# Patient Record
Sex: Female | Born: 1995 | Race: Black or African American | Hispanic: No | Marital: Single | Smoking: Never smoker
Health system: Southern US, Community
[De-identification: ages and names within clinical notes are randomized; demographics above are authoritative.]

## PROBLEM LIST (undated history)

## (undated) DIAGNOSIS — Z87440 Personal history of urinary (tract) infections: Secondary | ICD-10-CM

## (undated) DIAGNOSIS — R519 Headache, unspecified: Secondary | ICD-10-CM

## (undated) DIAGNOSIS — Z789 Other specified health status: Secondary | ICD-10-CM

## (undated) DIAGNOSIS — F32A Depression, unspecified: Secondary | ICD-10-CM

## (undated) DIAGNOSIS — D649 Anemia, unspecified: Secondary | ICD-10-CM

## (undated) HISTORY — DX: Headache, unspecified: R51.9

## (undated) HISTORY — DX: Personal history of urinary (tract) infections: Z87.440

## (undated) HISTORY — PX: NO PAST SURGERIES: SHX2092

## (undated) HISTORY — DX: Depression, unspecified: F32.A

## (undated) HISTORY — DX: Anemia, unspecified: D64.9

---
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
## 2014-03-06 ENCOUNTER — Ambulatory Visit: Payer: Self-pay | Admitting: Advanced Practice Midwife

## 2014-04-17 ENCOUNTER — Encounter: Payer: Self-pay | Admitting: Maternal & Fetal Medicine

## 2014-06-09 ENCOUNTER — Observation Stay: Payer: Self-pay | Admitting: Obstetrics and Gynecology

## 2014-06-09 LAB — URINALYSIS, COMPLETE
Bilirubin,UR: NEGATIVE
Blood: NEGATIVE
Glucose,UR: NEGATIVE mg/dL (ref 0–75)
Ketone: NEGATIVE
Leukocyte Esterase: NEGATIVE
Nitrite: NEGATIVE
PROTEIN: NEGATIVE
Ph: 6 (ref 4.5–8.0)
Specific Gravity: 1.005 (ref 1.003–1.030)

## 2014-07-13 ENCOUNTER — Observation Stay: Payer: Self-pay

## 2014-08-01 ENCOUNTER — Observation Stay: Payer: Self-pay | Admitting: Obstetrics and Gynecology

## 2014-08-02 ENCOUNTER — Inpatient Hospital Stay: Payer: Self-pay

## 2014-08-02 LAB — CBC WITH DIFFERENTIAL/PLATELET
BASOS ABS: 0 10*3/uL (ref 0.0–0.1)
Basophil %: 0.3 %
Eosinophil #: 0.1 10*3/uL (ref 0.0–0.7)
Eosinophil %: 0.6 %
HCT: 35 % (ref 35.0–47.0)
HGB: 10.9 g/dL — ABNORMAL LOW (ref 12.0–16.0)
LYMPHS ABS: 2 10*3/uL (ref 1.0–3.6)
Lymphocyte %: 21.3 %
MCH: 25.4 pg — AB (ref 26.0–34.0)
MCHC: 31.3 g/dL — ABNORMAL LOW (ref 32.0–36.0)
MCV: 81 fL (ref 80–100)
MONO ABS: 0.9 x10 3/mm (ref 0.2–0.9)
MONOS PCT: 9.5 %
NEUTROS PCT: 68.3 %
Neutrophil #: 6.5 10*3/uL (ref 1.4–6.5)
PLATELETS: 265 10*3/uL (ref 150–440)
RBC: 4.31 10*6/uL (ref 3.80–5.20)
RDW: 13.9 % (ref 11.5–14.5)
WBC: 9.5 10*3/uL (ref 3.6–11.0)

## 2014-08-02 LAB — DRUG SCREEN, URINE

## 2014-08-03 LAB — GC/CHLAMYDIA PROBE AMP

## 2014-08-05 LAB — HEMATOCRIT: HCT: 30.8 % — ABNORMAL LOW (ref 35.0–47.0)

## 2015-04-17 NOTE — H&P (Signed)
L&D Evaluation:  History:  HPI 19 yo G1 at 4138w1d [facility] presenting with LOF.  No VB, no ctx, +FM   Presents with leaking fluid   Patient's Medical History No Chronic Illness   Patient's Surgical History none   Medications Pre Natal Vitamins   Allergies NKDA   Social History drugs  +THC UDS   Family History Non-Contributory   Exam:  Vital Signs stable   Urine Protein not completed   General no apparent distress   Mental Status clear   Abdomen gravid, non-tender   Estimated Fetal Weight Average for gestational age   Edema no edema   FHT 140, moderate, no accels, no decels   Ucx absent   Impression:  Impression 19 yo G2P0010 at 2038w1d presentin for r/o PPROM   Plan:  Comments 1) R/O PPROM - no evidence of rupture  2) Fetus - category I tracing  3) Disposition - discharge home   Electronic Signatures: Lorrene ReidStaebler, Varonica Siharath M (MD)  (Signed 03-Jul-15 15:19)  Authored: L&D Evaluation   Last Updated: 03-Jul-15 15:19 by Lorrene ReidStaebler, Cindel Daugherty M (MD)

## 2015-04-17 NOTE — H&P (Signed)
L&D Evaluation:  History Expanded:  HPI 19 yo G1P0 at 653w5d by 1 st trimester US that agrees with LMP and EDC of 07/27/14 presenting with Decreased Fetal movement. She says she has asked the [facility] when they would set her up for induction and they were not responsive so she decided to come to the hospital today.  No VB, occ ctx. Pt has a history by UDS of MH usage but when asked today when her last time smoking was she was incredulous that we asked that. She has had excessive weight gain this preganancy. started at 149 now over 215 pounds, she has not  had tdap and declined on 05/15/14. S[facility]. Desires DMPA postpartum, 1 hour glucola is 53, suggestive of shunting to fetus of glucose. GBS neg on 7/30.   Gravida 2   Term 0   PreTerm 0   Abortion 1   Living 0   Blood Type (Maternal) O positive   Group B Strep Results Maternal (Result >5wks must be treated as unknown) negative    Maternal HIV Negative   Maternal Syphilis Ab Nonreactive   Maternal Varicella Immune   Rubella Results (Maternal) immune   Maternal T-Dap Unknown   [facility]   Presents with wanting to be induced and decreased fetal movement   Patient's Medical History No Chronic Illness   Patient's Surgical History none   Medications Pre Natal Vitamins   Allergies NKDA   Social History drugs  +THC UDS   Family History Non-Contributory   Current Prenatal Course Notable For poor visit history a lot of DNKA    Exam:  Vital Signs stable  133/60   Urine Protein not completed   General no apparent distress   Mental Status clear   Abdomen gravid, non-tender   Estimated Fetal Weight Average for gestational age   Fetal Position vertex   Edema no edema   Pelvic no external lesions, 1-2/50/-3 ballotable   Mebranes Intact   FHT 140, moderate, pos accels,  no decels   Ucx irregular   Skin dry   Lymph no lymphadenopathy    Impression:   Impression early labor, decreased fetal movement, 19 yo G2P0010 at 314w1d presentin for r/o PPROM   Plan:  Comments 1) decreased fetal movement-CAT 1 tracing no decels and pos accels,   2) postdates-CAT 1 tracing, induce Wed the 26th with cervicil for pit on Thursday.   3) Disposition - discharge home to retrun with contractions or SROM   Follow Up Appointment already scheduled   Electronic Signatures: Adria DevonKlett, Elijah Michaelis (MD)  (Signed 25-Aug-15 16:30)  Authored: L&D Evaluation   Last Updated: 25-Aug-15 16:30 by Adria DevonKlett, Zakayla Martinec (MD)

## 2015-04-17 NOTE — H&P (Signed)
L&D Evaluation:  History:  HPI 19 yo G1P0 at 8952w6d by 1 st trimester US that agrees with LMP=10/20/2013  and EDC of 07/27/14. She presents for IOL.   No VB. Has some mild ctxs which she describes as "tightening". Baby active. Prenatal care at [facility] remarkable for a positive UDS for MJ. She has had excessive weight gain this pregnancy. started at 149 now over 215 pounds, she has not  had tdap and declined on 05/15/14. SHe [facility] a [facility]. Desires DMPA postpartum, 1 hour glucola is 53, suggestive of shunting to fetus of glucose. GBS neg on 7/30. O POS/ Up to date on varicella and Rubella vaccines.   Presents with induction   Patient's Medical History No Chronic Illness   Patient's Surgical History none   Medications Pre Natal Vitamins   Allergies NKDA   Social History drugs  +THC UDS   Family History Non-Contributory   ROS:  ROS All systems were reviewed.  HEENT, CNS, GI, GU, Respiratory, CV, Renal and Musculoskeletal systems were found to be normal., see HPI   Exam:  Vital Signs stable  113/94, 120/66    Urine Protein not completed   General anxious   Mental Status clear    Chest clear    Heart normal sinus rhythm, no murmur/gallop/rubs   Abdomen gravid, non-tender   Estimated Fetal Weight 8#   Fetal Position cephalic   Edema +1 to +2 edema in ankles/feet    Reflexes 2+    Pelvic no external lesions, 1+/75%/-1   Mebranes Intact   FHT normal rate with no decels, 130 baseline with accels to 160s-170s   FHT Description moderate variability-CAt 1   Ucx q5-7, mild   Skin dry   Impression:  Impression IUP at 40 6/7 weeks for IOL   Plan:  Plan EFM/NST, monitor contractions and for cervical change, Cervidil tonight after discussion regarding risks of induction including hyperstimulation, FITL, C-section, failed induction. Sleeping pill if desires   Electronic Signatures: Trinna BalloonGutierrez, Flynt Breeze L (CNM)  (Signed 26-Aug-15  22:21)  Authored: L&D Evaluation   Last Updated: 26-Aug-15 22:21 by Trinna BalloonGutierrez, Durell Lofaso L (CNM)

## 2016-02-05 ENCOUNTER — Emergency Department
Admission: EM | Admit: 2016-02-05 | Discharge: 2016-02-05 | Disposition: A | Discharge status: Home / Self Care | Payer: Medicaid Other | Attending: Emergency Medicine | Admitting: Emergency Medicine

## 2016-02-05 ENCOUNTER — Encounter: Payer: Self-pay | Admitting: Medical Oncology

## 2016-02-05 DIAGNOSIS — L02413 Cutaneous abscess of right upper limb: Secondary | ICD-10-CM | POA: Diagnosis present

## 2016-02-05 MED ORDER — SULFAMETHOXAZOLE-TRIMETHOPRIM 800-160 MG PO TABS: 1.0000 | ORAL_TABLET | Freq: Two times a day (BID) | ORAL | Status: DC

## 2016-02-05 MED ORDER — OXYCODONE-ACETAMINOPHEN 5-325 MG PO TABS
1.0000 | ORAL_TABLET | Freq: Once | ORAL | Status: AC
  Administered 2016-02-05: 1 via ORAL
  Filled 2016-02-05: qty 1

## 2016-02-05 MED ORDER — OXYCODONE-ACETAMINOPHEN 7.5-325 MG PO TABS: 1.0000 | ORAL_TABLET | Freq: Four times a day (QID) | ORAL | Status: DC | PRN

## 2016-02-05 MED ORDER — LIDOCAINE HCL (PF) 1 % IJ SOLN
INTRAMUSCULAR | Status: AC
  Filled 2016-02-05: qty 5

## 2016-02-05 NOTE — ED Notes (Signed)
Pt has abscessed area to rt forearm.

## 2016-02-05 NOTE — Discharge Instructions (Signed)
Incision and Drainage Incision and drainage is a procedure in which a sac-like structure (cystic structure) is opened and drained. The area to be drained usually contains material such as pus, fluid, or blood.  LET YOUR CAREGIVER KNOW ABOUT:   Allergies to medicine.  Medicines taken, including vitamins, herbs, eyedrops, over-the-counter medicines, and creams.  Use of steroids (by mouth or creams).  Previous problems with anesthetics or numbing medicines.  History of bleeding problems or blood clots.  Previous surgery.  Other health problems, including diabetes and kidney problems.  Possibility of pregnancy, if this applies. RISKS AND COMPLICATIONS  Pain.  Bleeding.  Scarring.  Infection. BEFORE THE PROCEDURE  You may need to have an ultrasound or other imaging tests to see how large or deep your cystic structure is. Blood tests may also be used to determine if you have an infection or how severe the infection is. You may need to have a tetanus shot. PROCEDURE  The affected area is cleaned with a cleaning fluid. The cyst area will then be numbed with a medicine (local anesthetic). A small incision will be made in the cystic structure. A syringe or catheter may be used to drain the contents of the cystic structure, or the contents may be squeezed out. The area will then be flushed with a cleansing solution. After cleansing the area, it is often gently packed with a gauze or another wound dressing. Once it is packed, it will be covered with gauze and tape or some other type of wound dressing. AFTER THE PROCEDURE   Often, you will be allowed to go home right after the procedure.  You may be given antibiotic medicine to prevent or heal an infection.  If the area was packed with gauze or some other wound dressing, you will likely need to come back in 1 to 2 days to get it removed.  The area should heal in about 14 days.   This information is not intended to replace advice given  to you by your health care provider. Make sure you discuss any questions you have with your health care provider.   Document Released: 05/20/2001 Document Revised: 05/25/2012 Document Reviewed: 01/19/2012 Elsevier Interactive Patient Education 2016 Elsevier Inc.  

## 2016-02-05 NOTE — ED Provider Notes (Signed)
[facility] Emergency Department Provider Note  ____________________________________________  Time seen: Approximately 5:29 PM  I have reviewed the triage vital signs and the nursing notes.   HISTORY  Chief Complaint Abscess    HPI Betty Chavez is a 20 y.o. female patient complaining of a raised red area to the right forearm for 3-4 days. Patient is slight drainage from the area. Patient states she thinks bit by a spider. Patient denies any fever or chills associated with this complaint patient denies any loss sensation or function of the right forearm. No palliative measures taken for this complaint. Patient stated there is no pain at this time.  History reviewed. No pertinent past medical history.  There are no active problems to display for this patient.   History reviewed. No pertinent past surgical history.  Current Outpatient Rx  Name  Route  Sig  Dispense  Refill  . oxyCODONE-acetaminophen (PERCOCET) 7.5-325 MG tablet   Oral   Take 1 tablet by mouth every 6 (six) hours as needed for severe pain.   12 tablet   0   . sulfamethoxazole-trimethoprim (BACTRIM DS,SEPTRA DS) 800-160 MG tablet   Oral   Take 1 tablet by mouth 2 (two) times daily.   20 tablet   0     Allergies Review of patient's allergies indicates no known allergies.  No family history on file.  Social History Social History  Substance Use Topics  . Smoking status: None  . Smokeless tobacco: None  . Alcohol Use: None    Review of Systems Constitutional: No fever/chills Eyes: No visual changes. ENT: No sore throat. Cardiovascular: Denies chest pain. Respiratory: Denies shortness of breath. Gastrointestinal: No abdominal pain.  No nausea, no vomiting.  No diarrhea.  No constipation. Genitourinary: Negative for dysuria. Musculoskeletal: Negative for back pain. Skin: Negative for rash. Swelling and redness Neurological: Negative for headaches, focal weakness or  numbness.   ____________________________________________   PHYSICAL EXAM:  VITAL SIGNS: ED Triage Vitals  Enc Vitals Group     BP --      Pulse --      Resp --      Temp --      Temp src --      SpO2 --      Weight --      Height --      Head Cir --      Peak Flow --      Pain Score 02/05/16 1712 0     Pain Loc --      Pain Edu? --      Excl. in GC? --     Constitutional: Alert and oriented. Well appearing and in no acute distress. Eyes: Conjunctivae are normal. PERRL. EOMI. Head: Atraumatic. Nose: No congestion/rhinnorhea. Mouth/Throat: Mucous membranes are moist.  Oropharynx non-erythematous. Neck: No stridor. No cervical spine tenderness to palpation. Cardiovascular: Normal rate, regular rhythm. Grossly normal heart sounds.  Good peripheral circulation. Respiratory: Normal respiratory effort.  No retractions. Lungs CTAB. Gastrointestinal: Soft and nontender. No distention. No abdominal bruits. No CVA tenderness. Musculoskeletal: No lower extremity tenderness nor edema.  No joint effusions. Neurologic:  Normal speech and language. No gross focal neurologic deficits are appreciated. No gait instability. Skin:  Skin is warm, dry and intact. No rash noted. Nausea lesion on erythematous base. Mild discharge from the area. Psychiatric: Mood and affect are normal. Speech and behavior are normal.  ____________________________________________   LABS (all labs ordered are listed, but only abnormal  results are displayed)  Labs Reviewed - No data to display ____________________________________________  EKG   ____________________________________________  RADIOLOGY   ____________________________________________   PROCEDURES  Procedure(s) performed: See procedure note  Critical Care performed: No INCISION AND DRAINAGE Performed by: Joni Reining Consent: Verbal consent obtained. Risks and benefits: risks, benefits and alternatives were discussed Type:  abscess  Body area: Right forearm  Anesthesia: local infiltration  Incision was made with a scalpel.  Local anesthetic: lidocaine 1% without epinephrine  Anesthetic total: 2 ml  Complexity: complex Blunt dissection to break up loculations  Drainage: purulent  Drainage amount: Small   Packing material: 1/4 in iodoform gauze  Patient tolerance: Patient tolerated the procedure well with no immediate complications.    ____________________________________________   INITIAL IMPRESSION / ASSESSMENT AND PLAN / ED COURSE  Pertinent labs & imaging results that were available during my care of the patient were reviewed by me and considered in my medical decision making (see chart for details).  Abscess left forearm. Patient given discharge care instructions. Patient given a prescription for Percocet and Bactrim DS. Patient advised return to ER 2 days for reevaluation. ____________________________________________   FINAL CLINICAL IMPRESSION(S) / ED DIAGNOSES  Final diagnoses:  Abscess of right forearm      Joni Reining, PA-C 02/05/16 1819  Arnaldo Natal, MD 02/26/16 1123

## 2016-02-05 NOTE — ED Notes (Signed)
Developed a red raised area to right forearm about 3-4 days ago.  States area is draining slightly

## 2016-02-07 ENCOUNTER — Emergency Department
Admission: EM | Admit: 2016-02-07 | Discharge: 2016-02-07 | Disposition: A | Discharge status: Home / Self Care | Payer: Medicaid Other | Attending: Emergency Medicine | Admitting: Emergency Medicine

## 2016-02-07 ENCOUNTER — Encounter: Payer: Self-pay | Admitting: Emergency Medicine

## 2016-02-07 DIAGNOSIS — Z5189 Encounter for other specified aftercare: Secondary | ICD-10-CM

## 2016-02-07 DIAGNOSIS — Z79899 Other long term (current) drug therapy: Secondary | ICD-10-CM | POA: Diagnosis not present

## 2016-02-07 DIAGNOSIS — Z4801 Encounter for change or removal of surgical wound dressing: Secondary | ICD-10-CM | POA: Diagnosis present

## 2016-02-07 NOTE — ED Provider Notes (Signed)
[facility] Emergency Department Provider Note  ____________________________________________  Time seen: Approximately 3:45 PM  I have reviewed the triage vital signs and the nursing notes.   HISTORY  Chief Complaint Wound Check    HPI Betty Chavez is a 20 y.o. female who was seen on February 28 for an abscess to the right forearm. Incision and drainage was performed. She returns for a wound check. She reports pain has improved. She has not changed the bandage. No fevers or chills. Otherwise is doing fine. She did not obtain the antibiotics.   History reviewed. No pertinent past medical history.  There are no active problems to display for this patient.   History reviewed. No pertinent past surgical history.  Current Outpatient Rx  Name  Route  Sig  Dispense  Refill  . oxyCODONE-acetaminophen (PERCOCET) 7.5-325 MG tablet   Oral   Take 1 tablet by mouth every 6 (six) hours as needed for severe pain.   12 tablet   0   . sulfamethoxazole-trimethoprim (BACTRIM DS,SEPTRA DS) 800-160 MG tablet   Oral   Take 1 tablet by mouth 2 (two) times daily.   20 tablet   0     Allergies Review of patient's allergies indicates no known allergies.  No family history on file.  Social History Social History  Substance Use Topics  . Smoking status: Never Smoker   . Smokeless tobacco: None  . Alcohol Use: No    Review of Systems Constitutional: No fever/chills Eyes: No visual changes. ENT: No sore throat. Cardiovascular: Denies chest pain. Respiratory: Denies shortness of breath. Gastrointestinal: No abdominal pain.  No nausea, no vomiting.  No diarrhea.  No constipation. Genitourinary: Negative for dysuria. Musculoskeletal: Negative for back pain. Skin: Negative for rash. Neurological: Negative for headaches, focal weakness or numbness. 10-point ROS otherwise negative.  ____________________________________________   PHYSICAL EXAM:  VITAL  SIGNS: ED Triage Vitals  Enc Vitals Group     BP 02/07/16 1513 120/83 mmHg     Pulse Rate 02/07/16 1513 65     Resp 02/07/16 1513 16     Temp 02/07/16 1513 99 F (37.2 C)     Temp Source 02/07/16 1513 Oral     SpO2 02/07/16 1513 99 %     Weight 02/07/16 1513 143 lb (64.864 kg)     Height 02/07/16 1513  (1.702 m)     Head Cir --      Peak Flow --      Pain Score 02/07/16 1512 2     Pain Loc --      Pain Edu? --      Excl. in GC? --     Constitutional: Alert and oriented. Well appearing and in no acute distress. Eyes: Conjunctivae are normal. PERRL. EOMI. Neurologic:  Normal speech and language. No gross focal neurologic deficits are appreciated. No gait instability. Skin:  Skin is warm, dry and intact. Other than 1 cm open wound to the right forearm with mild surrounding induration, and erythema. Psychiatric: Mood and affect are normal. Speech and behavior are normal.  ____________________________________________   LABS (all labs ordered are listed, but only abnormal results are displayed)  Labs Reviewed - No data to display ____________________________________________  EKG     ____________________________________________  RADIOLOGY    ____________________________________________   PROCEDURES  Procedure(s) performed: None  Critical Care performed: No  ____________________________________________   INITIAL IMPRESSION / ASSESSMENT AND PLAN / ED COURSE  Pertinent labs & imaging results that  were available during my care of the patient were reviewed by me and considered in my medical decision making (see chart for details).  20 year old with a wound to the right forearm status post incision and drainage for a abscess. Bandage removed and packing removed. Mild induration around the wound. Minimal tenderness. Minimal erythema. Encouraged daily dressing changes, with cleansing soap and water. Encouraged taking her antibiotics. She can return for any  worsening symptoms. ____________________________________________   FINAL CLINICAL IMPRESSION(S) / ED DIAGNOSES  Final diagnoses:  Wound check, abscess      Ignacia Bayley, PA-C 02/07/16 1549  Arnaldo Natal, MD 02/07/16 2330

## 2016-02-07 NOTE — Discharge Instructions (Signed)
Wound Care Taking care of your wound properly can help to prevent pain and infection. It can also help your wound to heal more quickly.  HOW TO CARE FOR YOUR WOUND  Take or apply over-the-counter and prescription medicines only as told by your health care provider.  If you were prescribed antibiotic medicine, take or apply it as told by your health care provider. Do not stop using the antibiotic even if your condition improves.  Clean the wound each day or as told by your health care provider.  Wash the wound with mild soap and water.  Rinse the wound with water to remove all soap.  Pat the wound dry with a clean towel. Do not rub it.  There are many different ways to close and cover a wound. For example, a wound can be covered with stitches (sutures), skin glue, or adhesive strips. Follow instructions from your health care provider about:  How to take care of your wound.  When and how you should change your bandage (dressing).  When you should remove your dressing.  Removing whatever was used to close your wound.  Check your wound every day for signs of infection. Watch for:  Redness, swelling, or pain.  Fluid, blood, or pus.  Keep the dressing dry until your health care provider says it can be removed. Do not take baths, swim, use a hot tub, or do anything that would put your wound underwater until your health care provider approves.  Raise (elevate) the injured area above the level of your heart while you are sitting or lying down.  Do not scratch or pick at the wound.  Keep all follow-up visits as told by your health care provider. This is important. SEEK MEDICAL CARE IF:  You received a tetanus shot and you have swelling, severe pain, redness, or bleeding at the injection site.  You have a fever.  Your pain is not controlled with medicine.  You have increased redness, swelling, or pain at the site of your wound.  You have fluid, blood, or pus coming from your  wound.  You notice a bad smell coming from your wound or your dressing. SEEK IMMEDIATE MEDICAL CARE IF:  You have a red streak going away from your wound.   This information is not intended to replace advice given to you by your health care provider. Make sure you discuss any questions you have with your health care provider.   Document Released: 09/02/2008 Document Revised: 04/10/2015 Document Reviewed: 11/20/2014 Elsevier Interactive Patient Education 2016 [employer].  Continue to cleanse wound daily with soap and water.  Watch for signs of infection.  Take antibiotics as directed and return for any worsening symptoms.

## 2016-02-07 NOTE — ED Notes (Signed)
Here for wound check of right arm abscess

## 2016-12-02 DIAGNOSIS — O0932 Supervision of pregnancy with insufficient antenatal care, second trimester: Secondary | ICD-10-CM

## 2016-12-02 DIAGNOSIS — Z3493 Encounter for supervision of normal pregnancy, unspecified, third trimester: Secondary | ICD-10-CM

## 2016-12-08 DIAGNOSIS — B999 Unspecified infectious disease: Secondary | ICD-10-CM

## 2016-12-08 HISTORY — DX: Unspecified infectious disease: B99.9

## 2016-12-08 NOTE — L&D Delivery Note (Signed)
Delivery Note At 3:14 AM  On 5/26/18a viable and feamle  was delivered via Vaginal, Spontaneous Delivery (Presentation: roa;  ).  APGAR:9/9 , ; weight  #7/7.   Placenta status:intact  , .  Cord:  with the following complications: . none . Delayed cord clamping . Rapid second stage   Anesthesia:  none Episiotomy: None Lacerations:  none :Est. Blood Loss (mL):100 cc    Mom to postpartum.  Baby to Couplet care / Skin to Skin.  Betty Chavez 05/02/2017, 3:22 AM

## 2016-12-09 DIAGNOSIS — Z2839 Other underimmunization status: Secondary | ICD-10-CM | POA: Insufficient documentation

## 2016-12-09 DIAGNOSIS — O09899 Supervision of other high risk pregnancies, unspecified trimester: Secondary | ICD-10-CM | POA: Insufficient documentation

## 2016-12-10 DIAGNOSIS — F1290 Cannabis use, unspecified, uncomplicated: Secondary | ICD-10-CM | POA: Insufficient documentation

## 2017-05-02 ENCOUNTER — Inpatient Hospital Stay: Payer: Medicaid Other | Admitting: Anesthesiology

## 2017-05-02 ENCOUNTER — Inpatient Hospital Stay
Admission: EM | Admit: 2017-05-02 | Discharge: 2017-05-03 | DRG: 775 | Disposition: A | Discharge status: Home / Self Care | Payer: Medicaid Other | Attending: Obstetrics and Gynecology | Admitting: Obstetrics and Gynecology

## 2017-05-02 DIAGNOSIS — Z3A40 40 weeks gestation of pregnancy: Secondary | ICD-10-CM

## 2017-05-02 DIAGNOSIS — O99324 Drug use complicating childbirth: Secondary | ICD-10-CM | POA: Diagnosis present

## 2017-05-02 DIAGNOSIS — Z3493 Encounter for supervision of normal pregnancy, unspecified, third trimester: Secondary | ICD-10-CM | POA: Diagnosis present

## 2017-05-02 DIAGNOSIS — F1290 Cannabis use, unspecified, uncomplicated: Secondary | ICD-10-CM | POA: Diagnosis present

## 2017-05-02 DIAGNOSIS — O99824 Streptococcus B carrier state complicating childbirth: Principal | ICD-10-CM | POA: Diagnosis present

## 2017-05-02 DIAGNOSIS — O479 False labor, unspecified: Secondary | ICD-10-CM | POA: Diagnosis present

## 2017-05-02 LAB — URINE DRUG SCREEN, QUALITATIVE (ARMC ONLY)
Amphetamines, Ur Screen: NOT DETECTED
BARBITURATES, UR SCREEN: NOT DETECTED
Benzodiazepine, Ur Scrn: NOT DETECTED
COCAINE METABOLITE, UR ~~LOC~~: NOT DETECTED
Cannabinoid 50 Ng, Ur ~~LOC~~: POSITIVE — AB
MDMA (ECSTASY) UR SCREEN: NOT DETECTED
METHADONE SCREEN, URINE: NOT DETECTED
OPIATE, UR SCREEN: NOT DETECTED
Phencyclidine (PCP) Ur S: NOT DETECTED
TRICYCLIC, UR SCREEN: NOT DETECTED

## 2017-05-02 LAB — TYPE AND SCREEN
ABO/RH(D): O POS
Antibody Screen: NEGATIVE

## 2017-05-02 LAB — CBC
HCT: 34.9 % — ABNORMAL LOW (ref 35.0–47.0)
HEMOGLOBIN: 11.9 g/dL — AB (ref 12.0–16.0)
MCH: 27.8 pg (ref 26.0–34.0)
MCHC: 34.2 g/dL (ref 32.0–36.0)
MCV: 81.2 fL (ref 80.0–100.0)
Platelets: 233 10*3/uL (ref 150–440)
RBC: 4.29 MIL/uL (ref 3.80–5.20)
RDW: 13.4 % (ref 11.5–14.5)
WBC: 7.5 10*3/uL (ref 3.6–11.0)

## 2017-05-02 LAB — RAPID HIV SCREEN (HIV 1/2 AB+AG)
HIV 1/2 Antibodies: NONREACTIVE
HIV-1 P24 Antigen - HIV24: NONREACTIVE

## 2017-05-02 LAB — OB RESULTS CONSOLE VARICELLA ZOSTER ANTIBODY, IGG: Varicella: NON-IMMUNE/NOT IMMUNE

## 2017-05-02 LAB — OB RESULTS CONSOLE RUBELLA ANTIBODY, IGM: Rubella: IMMUNE

## 2017-05-02 LAB — OB RESULTS CONSOLE GBS: STREP GROUP B AG: POSITIVE

## 2017-05-02 MED ORDER — LACTATED RINGERS IV SOLN: 500.0000 mL | INTRAVENOUS | Status: DC | PRN

## 2017-05-02 MED ORDER — HYDROCODONE-ACETAMINOPHEN 5-325 MG PO TABS
1.0000 | ORAL_TABLET | ORAL | Status: DC | PRN
  Administered 2017-05-02: 1 via ORAL
  Filled 2017-05-02: qty 1

## 2017-05-02 MED ORDER — OXYTOCIN BOLUS FROM INFUSION
500.0000 mL | Freq: Once | INTRAVENOUS | Status: AC
  Administered 2017-05-02: 500 mL via INTRAVENOUS

## 2017-05-02 MED ORDER — ZOLPIDEM TARTRATE 5 MG PO TABS: 5.0000 mg | ORAL_TABLET | Freq: Every evening | ORAL | Status: DC | PRN

## 2017-05-02 MED ORDER — ONDANSETRON HCL 4 MG/2ML IJ SOLN: 4.0000 mg | INTRAMUSCULAR | Status: DC | PRN

## 2017-05-02 MED ORDER — WITCH HAZEL-GLYCERIN EX PADS: 1.0000 "application " | MEDICATED_PAD | CUTANEOUS | Status: DC | PRN

## 2017-05-02 MED ORDER — MAGNESIUM HYDROXIDE 400 MG/5ML PO SUSP
30.0000 mL | ORAL | Status: DC | PRN
  Filled 2017-05-02: qty 30

## 2017-05-02 MED ORDER — DIPHENHYDRAMINE HCL 25 MG PO CAPS: 25.0000 mg | ORAL_CAPSULE | Freq: Four times a day (QID) | ORAL | Status: DC | PRN

## 2017-05-02 MED ORDER — SIMETHICONE 80 MG PO CHEW: 80.0000 mg | CHEWABLE_TABLET | ORAL | Status: DC | PRN

## 2017-05-02 MED ORDER — LACTATED RINGERS IV SOLN
INTRAVENOUS | Status: DC
  Administered 2017-05-02: 12:00:00 via INTRAVENOUS

## 2017-05-02 MED ORDER — LIDOCAINE HCL (PF) 1 % IJ SOLN
INTRAMUSCULAR | Status: AC
  Filled 2017-05-02: qty 30

## 2017-05-02 MED ORDER — MEASLES, MUMPS & RUBELLA VAC ~~LOC~~ INJ
0.5000 mL | INJECTION | Freq: Once | SUBCUTANEOUS | Status: DC
  Filled 2017-05-02: qty 0.5

## 2017-05-02 MED ORDER — PRENATAL MULTIVITAMIN CH
1.0000 | ORAL_TABLET | Freq: Every day | ORAL | Status: DC
  Administered 2017-05-02: 1 via ORAL
  Filled 2017-05-02: qty 1

## 2017-05-02 MED ORDER — OXYTOCIN 40 UNITS IN LACTATED RINGERS INFUSION - SIMPLE MED: 2.5000 [IU]/h | INTRAVENOUS | Status: DC

## 2017-05-02 MED ORDER — OXYCODONE-ACETAMINOPHEN 5-325 MG PO TABS: 2.0000 | ORAL_TABLET | ORAL | Status: DC | PRN

## 2017-05-02 MED ORDER — AMMONIA AROMATIC IN INHA
RESPIRATORY_TRACT | Status: AC
  Filled 2017-05-02: qty 10

## 2017-05-02 MED ORDER — IBUPROFEN 600 MG PO TABS
600.0000 mg | ORAL_TABLET | Freq: Four times a day (QID) | ORAL | Status: DC
  Administered 2017-05-02: 600 mg via ORAL
  Filled 2017-05-02 (×2): qty 1

## 2017-05-02 MED ORDER — ONDANSETRON HCL 4 MG/2ML IJ SOLN: 4.0000 mg | Freq: Four times a day (QID) | INTRAMUSCULAR | Status: DC | PRN

## 2017-05-02 MED ORDER — BENZOCAINE-MENTHOL 20-0.5 % EX AERO: 1.0000 "application " | INHALATION_SPRAY | CUTANEOUS | Status: DC | PRN

## 2017-05-02 MED ORDER — ACETAMINOPHEN 325 MG PO TABS
650.0000 mg | ORAL_TABLET | ORAL | Status: DC | PRN
  Administered 2017-05-02: 650 mg via ORAL

## 2017-05-02 MED ORDER — LIDOCAINE HCL (PF) 1 % IJ SOLN: 30.0000 mL | INTRAMUSCULAR | Status: DC | PRN

## 2017-05-02 MED ORDER — COCONUT OIL OIL: 1.0000 "application " | TOPICAL_OIL | Status: DC | PRN

## 2017-05-02 MED ORDER — IBUPROFEN 600 MG PO TABS
ORAL_TABLET | ORAL | Status: AC
  Administered 2017-05-02: 600 mg
  Filled 2017-05-02: qty 1

## 2017-05-02 MED ORDER — MISOPROSTOL 200 MCG PO TABS
ORAL_TABLET | ORAL | Status: AC
  Filled 2017-05-02: qty 4

## 2017-05-02 MED ORDER — DIBUCAINE 1 % RE OINT: 1.0000 "application " | TOPICAL_OINTMENT | RECTAL | Status: DC | PRN

## 2017-05-02 MED ORDER — OXYCODONE-ACETAMINOPHEN 5-325 MG PO TABS
1.0000 | ORAL_TABLET | ORAL | Status: DC | PRN
  Administered 2017-05-02: 1 via ORAL
  Filled 2017-05-02 (×2): qty 1

## 2017-05-02 MED ORDER — ONDANSETRON HCL 4 MG PO TABS: 4.0000 mg | ORAL_TABLET | ORAL | Status: DC | PRN

## 2017-05-02 MED ORDER — FERROUS SULFATE 325 (65 FE) MG PO TABS
325.0000 mg | ORAL_TABLET | Freq: Two times a day (BID) | ORAL | Status: DC
  Administered 2017-05-02 – 2017-05-03 (×2): 325 mg via ORAL
  Filled 2017-05-02 (×2): qty 1

## 2017-05-02 MED ORDER — FENTANYL 2.5 MCG/ML W/ROPIVACAINE 0.2% IN NS 100 ML EPIDURAL INFUSION (ARMC-ANES)
EPIDURAL | Status: AC
  Filled 2017-05-02: qty 100

## 2017-05-02 MED ORDER — BUTORPHANOL TARTRATE 1 MG/ML IJ SOLN: 1.0000 mg | INTRAMUSCULAR | Status: DC | PRN

## 2017-05-02 MED ORDER — SOD CITRATE-CITRIC ACID 500-334 MG/5ML PO SOLN
30.0000 mL | ORAL | Status: DC | PRN
  Filled 2017-05-02: qty 30

## 2017-05-02 MED ORDER — ACETAMINOPHEN 325 MG PO TABS
650.0000 mg | ORAL_TABLET | ORAL | Status: DC | PRN
  Filled 2017-05-02: qty 2

## 2017-05-02 MED ORDER — OXYTOCIN 10 UNIT/ML IJ SOLN
INTRAMUSCULAR | Status: AC
  Filled 2017-05-02: qty 2

## 2017-05-02 MED ORDER — SODIUM CHLORIDE 0.9 % IV SOLN
INTRAVENOUS | Status: AC
  Administered 2017-05-02: 2 g via INTRAVENOUS
  Filled 2017-05-02: qty 2000

## 2017-05-02 MED ORDER — SODIUM CHLORIDE 0.9 % IV SOLN
2.0000 g | INTRAVENOUS | Status: DC
  Administered 2017-05-02: 2 g via INTRAVENOUS
  Filled 2017-05-02 (×6): qty 2000

## 2017-05-02 MED ORDER — OXYTOCIN 40 UNITS IN LACTATED RINGERS INFUSION - SIMPLE MED
INTRAVENOUS | Status: AC
  Filled 2017-05-02: qty 1000

## 2017-05-02 MED ORDER — SENNOSIDES-DOCUSATE SODIUM 8.6-50 MG PO TABS: 2.0000 | ORAL_TABLET | ORAL | Status: DC

## 2017-05-02 NOTE — Discharge Summary (Signed)
Obstetric Discharge Summary   Patient ID: Patient Name: Betty Chavez Yablonski DOB: 09/22/1996 MRN: [id number]  Date of Admission: 05/02/2017 Date of Discharge: 05/03/17 [facility] Gestational Age at Delivery: 6874w0d   Antepartum complications: +MJ use Admitting Diagnosis:active labor  Secondary Diagnoses: Patient Active Problem List   Diagnosis Date Noted  . Uterine contractions during pregnancy 05/02/2017    Augmentation: None Complications: none Intrapartum complications/course: rapid SVD , inadequate tx for GBS ( nursery staff notified )  Date of Delivery: 05/02/17 at 0317 Delivered By: Lourdes Kucharski MD Delivery Type: SVD Anesthesia: none Placenta: sponatneous Laceration: none Episiotomy: none  Newborn Data: Live born unspecifiedfemaleBirth Weight:  #7/7 APGAR 9/9: ,       Postpartum Course  Patient had an uncomplicated postpartum course.  By time of discharge on PPD#1, her pain was controlled on oral pain medications; she had appropriate lochia and was ambulating, voiding without difficulty and tolerating regular diet.  She was deemed stable for discharge to home.     Labs: CBC Latest Ref Rng & Units 05/03/2017 05/02/2017 08/05/2014  WBC 3.6 - 11.0 K/uL 9.0 7.5 -  Hemoglobin 12.0 - 16.0 g/dL 10.9(Chavez) 11.9(Chavez) -  Hematocrit 35.0 - 47.0 % 33.5(Chavez) 34.9(Chavez) 30.8(Chavez)  Platelets 150 - 440 K/uL 193 233 -   O POS  Physical exam:  BP 127/72 (BP Location: Left Arm)   Pulse 60   Temp 98.3 F (36.8 C) (Oral)   Resp 20   Ht 5\' 7"  (1.702 m)   Wt 95.3 kg (210 lb)   SpO2 100%   BMI 32.89 kg/m  General: alert and no distress Pulm: normal respiratory effort Lochia: appropriate Abdomen: soft, NT Uterine Fundus: firm, below umbilicus Extremities: No evidence of DVT seen on physical exam. No lower extremity edema.   Disposition: stable, discharge to home Baby Feeding: formula Baby Disposition: home with mom  Contraception:undecided  Prenatal Labs:  O+  varicella non immune   Plan:  Betty Chavez Perrell was discharged to home in good condition. [facility]  Discharge Instructions: Per After Visit Summary. Activity: Advance as tolerated. Pelvic rest for 6 weeks.  Refer to After Visit Summary Diet: Regular Discharge Medications:varivax Allergies as of 05/03/2017   No Known Allergies     Medication List    STOP taking these medications   oxyCODONE-acetaminophen 7.5-325 MG tablet Commonly known as:  PERCOCET   sulfamethoxazole-trimethoprim 800-160 MG tablet Commonly known as:  BACTRIM DS,SEPTRA DS     TAKE these medications   benzocaine-Menthol 20-0.5 % Aero Commonly known as:  DERMOPLAST Apply 1 application topically as needed for irritation (perineal discomfort).   ibuprofen 600 MG tablet Commonly known as:  ADVIL,MOTRIN Take 1 tablet (600 mg total) by mouth every 6 (six) hours.      Outpatient follow up:  Follow-up Information    Saeed Toren, Ihor Austinhomas J, MD Follow up in 6 week(s).   Specialty:  Obstetrics and Gynecology Why:  pp exam  Contact information: [address] [facility]  [place] [id number] [phone]            Signed:  Ihor Austinhomas J Oni Dietzman 05/03/17

## 2017-05-02 NOTE — H&P (Signed)
Betty BuddsJamonica L Bucker is a 21 y.o. female presenting foractive labor Cx 7 cm / 80 0 AROM bulging bag clear fluid . POOR prenatal care   OB History    Gravida Para Term Preterm AB Living   3 1 1  0 1 1   SAB TAB Ectopic Multiple Live Births   1 0 0 0 1     History reviewed. No pertinent past medical history. History reviewed. No pertinent surgical history. Family History: family history is not on file. Social History:  reports that she has never smoked. She does not have any smokeless tobacco history on file. She reports that she does not drink alcohol. Her drug history is not on file.     Maternal Diabetes: No Genetic Screening: Normal Maternal Ultrasounds/Referrals: Normal Fetal Ultrasounds or other Referrals:  None Maternal Substance Abuse:  Yes:  Type: Marijuana Significant Maternal Medications:  None Significant Maternal Lab Results:  None Other Comments:  None  ROS History Dilation: 6 Effacement (%): 80 Blood pressure 130/72, pulse 65.   CX by TJs 7 cm / 80 /0 VTX clear fluid Exam Physical Exam  LUNGs CTA  CV RRR  abd soft gravid  Reassuring fetal monitoring  Prenatal labs: ABO, Rh:  O+ Antibody:  neg Rubella:  Imm , Varicella Non immune  RPR:   nr HBsAg:   neg HIV:   neg  GBS:   +  Assessment/Plan: Advanced cervical dilation  Start ampicillin  2 gm / hr  Anticipate SVD in near future    [place] J Tierany Appleby 05/02/2017, 2:32 AM

## 2017-05-03 LAB — CBC
HEMATOCRIT: 33.5 % — AB (ref 35.0–47.0)
HEMOGLOBIN: 10.9 g/dL — AB (ref 12.0–16.0)
MCH: 26.4 pg (ref 26.0–34.0)
MCHC: 32.6 g/dL (ref 32.0–36.0)
MCV: 81 fL (ref 80.0–100.0)
Platelets: 193 10*3/uL (ref 150–440)
RBC: 4.13 MIL/uL (ref 3.80–5.20)
RDW: 13.3 % (ref 11.5–14.5)
WBC: 9 10*3/uL (ref 3.6–11.0)

## 2017-05-03 LAB — RPR: RPR: NONREACTIVE

## 2017-05-03 MED ORDER — BENZOCAINE-MENTHOL 20-0.5 % EX AERO
1.0000 | INHALATION_SPRAY | CUTANEOUS | 1 refills | Status: DC | PRN
Start: 2017-05-03 — End: 2018-04-20

## 2017-05-03 MED ORDER — VARICELLA VIRUS VACCINE LIVE 1350 PFU/0.5ML IJ SUSR
0.5000 mL | Freq: Once | INTRAMUSCULAR | Status: AC
  Administered 2017-05-03: 0.5 mL via SUBCUTANEOUS
  Filled 2017-05-03 (×3): qty 0.5

## 2017-05-03 MED ORDER — IBUPROFEN 600 MG PO TABS: 600.0000 mg | ORAL_TABLET | Freq: Four times a day (QID) | ORAL | 0 refills | Status: DC

## 2017-05-03 NOTE — Progress Notes (Signed)
Discharge instructions complete and prescriptions given. Patient verbalizes understanding of teaching. Patient discharged home at 1415. 

## 2017-05-03 NOTE — Clinical Social Work Maternal (Signed)
  CLINICAL SOCIAL WORK MATERNAL/CHILD NOTE  Patient Details  Name: Betty Chavez MRN: [id number] Date of Birth: Nov 02, 1996  Date:  05/03/2017  Clinical Social Worker Initiating Note:  Santiago Bumpers, MSW, [place]  Date/ Time Initiated:  05/03/17/1125     Child's Name:  Junius Roads   Legal Guardian:  Mother   Need for Interpreter:  None   Date of Referral:  05/03/17     Reason for Referral:  Current Substance Use/Substance Use During Pregnancy    Referral Source:  RN   Address:  [address], [place], [place] [address]  Phone number:  [phone]   Household Members:  Self, Minor Children   Natural Supports (not living in the home):  [place], Medical laboratory scientific officer, Extended Family, Friends, Immediate Family, Education administrator, Building services engineer other, Artist Supports:     Employment: Part-time   Type of Work: Ambulance person   Education:  Database administrator Resources:  Medicaid   Other Resources:  Physicist, medical , [place] Considerations Which May Impact Care:  The patient reports that she has support from her church family.  Strengths:  Ability to meet basic needs , Compliance with medical plan , Home prepared for child , Pediatrician chosen , Understanding of illness   Risk Factors/Current Problems:  Substance Use    Cognitive State:  Alert , Insightful , Goal Oriented    Mood/Affect:  Apprehensive    CSW Assessment: The CSW met with the patient at bedside to discuss the positive UDS for marijuana for both mother and baby. The patient indicated that she last smoked marijuana 2 weeks ago due to nausea. The CSW provided psychoeducation concerning the dangers of cannabis use during pregnancy and that it can be transmitted via breast milk. The patient reported that she plans to bottle feed. The CSW advised the patient that as a mandated reporter, the CSW is obligated to make a report to CPS regarding the UDS results. The patient verbalized  understanding and asked appropriate questions. The CSW gave information regarding the process by DSS/CPS.   The patient lives in a duplex with her 58 year old son. The patient reports that the father of the baby is involved. The patient has a car seat and all other needs for her child; she plans to sign up for [facility] at [place] on Tuesday. The patient reports strong support from multiple family members as well as her neighbors, community, and church.   The CSW made a mandated report to CPS at 1135 on 05/03/2017. The CPS worker is aware that the patient may discharge today to home or tomorrow. The CSW is signing off; please consult should any other needs arise.  CSW Plan/Description:  Child Copy Report , Engineer, mining , Information/Referral to [employer], [place] 05/03/2017, 11:27 AM

## 2017-10-22 ENCOUNTER — Other Ambulatory Visit: Payer: Self-pay | Admitting: Family Medicine

## 2017-10-22 DIAGNOSIS — Z3201 Encounter for pregnancy test, result positive: Secondary | ICD-10-CM

## 2017-12-08 NOTE — L&D Delivery Note (Signed)
Date of delivery: 04/18/18 Estimated Date of Delivery: 04/17/18 Patient's last menstrual period was 07/11/2017 (lmp unknown). EGA: [redacted]w[redacted]d  Delivery Note At 6:58 PM a viable female was delivered via Vaginal, Spontaneous (Presentation: cephalic; LOA).  APGAR: 8, 9; weight pending Placenta status: spontaneous, intact.  Cord: 3vv,  with the following complications: none noted.  Cord pH: not collected  Anesthesia:  Nitrous oxide Episiotomy:  no Lacerations:  none Suture Repair: none Est. Blood Loss (mL):  275 (measured)  Mom presented to L&D with labor, augmented with AROM and pitocin. Progressed to complete, second stage: 2 pushes.  delivery of fetal head with restitution to ROT.   Anterior then posterior shoulders delivered without difficulty.  Baby placed on mom's chest, and attended to by peds.   Cord was then clamped and cut by FOB.  Placenta spontaneously delivered, intact.   IV pitocin given for hemorrhage prophylaxis.  We sang happy birthday to baby Semyah, and Happy Mother's Day to [place].   Mom to postpartum.  Baby to Couplet care / Skin to Skin.  Chelsea C Ward 04/18/2018, 7:10 PM

## 2018-01-29 ENCOUNTER — Other Ambulatory Visit: Payer: Self-pay | Admitting: Family Medicine

## 2018-01-29 DIAGNOSIS — Z3201 Encounter for pregnancy test, result positive: Secondary | ICD-10-CM

## 2018-02-04 ENCOUNTER — Other Ambulatory Visit: Payer: Self-pay | Admitting: Family Medicine

## 2018-02-04 ENCOUNTER — Ambulatory Visit
Admission: RE | Admit: 2018-02-04 | Discharge: 2018-02-04 | Disposition: A | Discharge status: Home / Self Care | Payer: Self-pay | Source: Ambulatory Visit | Attending: Family Medicine | Admitting: Family Medicine

## 2018-02-04 DIAGNOSIS — Z3201 Encounter for pregnancy test, result positive: Secondary | ICD-10-CM

## 2018-02-04 DIAGNOSIS — O321XX0 Maternal care for breech presentation, not applicable or unspecified: Secondary | ICD-10-CM | POA: Insufficient documentation

## 2018-02-04 DIAGNOSIS — Z362 Encounter for other antenatal screening follow-up: Secondary | ICD-10-CM | POA: Insufficient documentation

## 2018-02-04 DIAGNOSIS — Z3A30 30 weeks gestation of pregnancy: Secondary | ICD-10-CM | POA: Insufficient documentation

## 2018-02-26 ENCOUNTER — Other Ambulatory Visit: Payer: Self-pay | Admitting: Family Medicine

## 2018-02-26 DIAGNOSIS — Z3483 Encounter for supervision of other normal pregnancy, third trimester: Secondary | ICD-10-CM

## 2018-02-26 DIAGNOSIS — O26843 Uterine size-date discrepancy, third trimester: Secondary | ICD-10-CM

## 2018-03-02 ENCOUNTER — Ambulatory Visit: Admission: RE | Admit: 2018-03-02 | Payer: Self-pay | Source: Ambulatory Visit

## 2018-03-04 ENCOUNTER — Ambulatory Visit
Admission: RE | Admit: 2018-03-04 | Discharge: 2018-03-04 | Disposition: A | Discharge status: Home / Self Care | Payer: Self-pay | Source: Ambulatory Visit | Attending: Family Medicine | Admitting: Family Medicine

## 2018-03-04 DIAGNOSIS — O26843 Uterine size-date discrepancy, third trimester: Secondary | ICD-10-CM

## 2018-03-04 DIAGNOSIS — Z3A33 33 weeks gestation of pregnancy: Secondary | ICD-10-CM | POA: Insufficient documentation

## 2018-03-04 DIAGNOSIS — Z3483 Encounter for supervision of other normal pregnancy, third trimester: Secondary | ICD-10-CM | POA: Insufficient documentation

## 2018-04-18 ENCOUNTER — Other Ambulatory Visit: Payer: Self-pay

## 2018-04-18 ENCOUNTER — Encounter: Payer: Self-pay | Admitting: *Deleted

## 2018-04-18 ENCOUNTER — Inpatient Hospital Stay
Admission: EM | Admit: 2018-04-18 | Discharge: 2018-04-20 | DRG: 806 | Disposition: A | Discharge status: Home / Self Care | Payer: Medicaid Other | Attending: Obstetrics & Gynecology | Admitting: Obstetrics & Gynecology

## 2018-04-18 DIAGNOSIS — Z3A40 40 weeks gestation of pregnancy: Secondary | ICD-10-CM | POA: Diagnosis not present

## 2018-04-18 DIAGNOSIS — O9081 Anemia of the puerperium: Secondary | ICD-10-CM | POA: Diagnosis not present

## 2018-04-18 DIAGNOSIS — D62 Acute posthemorrhagic anemia: Secondary | ICD-10-CM | POA: Diagnosis not present

## 2018-04-18 DIAGNOSIS — O99824 Streptococcus B carrier state complicating childbirth: Principal | ICD-10-CM | POA: Diagnosis present

## 2018-04-18 DIAGNOSIS — Z3483 Encounter for supervision of other normal pregnancy, third trimester: Secondary | ICD-10-CM | POA: Diagnosis present

## 2018-04-18 HISTORY — DX: Other specified health status: Z78.9

## 2018-04-18 LAB — TYPE AND SCREEN
ABO/RH(D): O POS
Antibody Screen: NEGATIVE

## 2018-04-18 LAB — CBC
HCT: 32.2 % — ABNORMAL LOW (ref 35.0–47.0)
Hemoglobin: 10.8 g/dL — ABNORMAL LOW (ref 12.0–16.0)
MCH: 26.5 pg (ref 26.0–34.0)
MCHC: 33.5 g/dL (ref 32.0–36.0)
MCV: 79.2 fL — ABNORMAL LOW (ref 80.0–100.0)
PLATELETS: 260 10*3/uL (ref 150–440)
RBC: 4.06 MIL/uL (ref 3.80–5.20)
RDW: 13.5 % (ref 11.5–14.5)
WBC: 8.9 10*3/uL (ref 3.6–11.0)

## 2018-04-18 MED ORDER — LACTATED RINGERS IV SOLN: 500.0000 mL | INTRAVENOUS | Status: DC | PRN

## 2018-04-18 MED ORDER — SODIUM CHLORIDE 0.9 % IV SOLN
INTRAVENOUS | Status: AC
  Filled 2018-04-18: qty 1000

## 2018-04-18 MED ORDER — DIPHENHYDRAMINE HCL 25 MG PO CAPS
25.0000 mg | ORAL_CAPSULE | Freq: Four times a day (QID) | ORAL | Status: DC | PRN
Start: 2018-04-18 — End: 2018-04-20

## 2018-04-18 MED ORDER — WITCH HAZEL-GLYCERIN EX PADS: 1.0000 "application " | MEDICATED_PAD | CUTANEOUS | Status: DC

## 2018-04-18 MED ORDER — ONDANSETRON HCL 4 MG PO TABS: 4.0000 mg | ORAL_TABLET | ORAL | Status: DC | PRN

## 2018-04-18 MED ORDER — SIMETHICONE 80 MG PO CHEW: 80.0000 mg | CHEWABLE_TABLET | ORAL | Status: DC | PRN

## 2018-04-18 MED ORDER — SODIUM CHLORIDE 0.9 % IV SOLN
2.0000 g | Freq: Once | INTRAVENOUS | Status: AC
  Administered 2018-04-18: 2 g via INTRAVENOUS
  Filled 2018-04-18: qty 2000

## 2018-04-18 MED ORDER — ACETAMINOPHEN 500 MG PO TABS
1000.0000 mg | ORAL_TABLET | Freq: Four times a day (QID) | ORAL | Status: DC | PRN
  Administered 2018-04-18: 1000 mg via ORAL
  Filled 2018-04-18: qty 2

## 2018-04-18 MED ORDER — ONDANSETRON HCL 4 MG/2ML IJ SOLN: 4.0000 mg | Freq: Four times a day (QID) | INTRAMUSCULAR | Status: DC | PRN

## 2018-04-18 MED ORDER — LACTATED RINGERS IV SOLN
INTRAVENOUS | Status: DC
  Administered 2018-04-18: 13:00:00 via INTRAVENOUS

## 2018-04-18 MED ORDER — OXYTOCIN 40 UNITS IN LACTATED RINGERS INFUSION - SIMPLE MED
INTRAVENOUS | Status: AC
  Filled 2018-04-18: qty 1000

## 2018-04-18 MED ORDER — AMMONIA AROMATIC IN INHA
RESPIRATORY_TRACT | Status: AC
  Filled 2018-04-18: qty 10

## 2018-04-18 MED ORDER — SOD CITRATE-CITRIC ACID 500-334 MG/5ML PO SOLN: 30.0000 mL | ORAL | Status: DC | PRN

## 2018-04-18 MED ORDER — DOCUSATE SODIUM 100 MG PO CAPS
100.0000 mg | ORAL_CAPSULE | Freq: Two times a day (BID) | ORAL | Status: DC
Start: 2018-04-18 — End: 2018-04-20
  Administered 2018-04-18 – 2018-04-20 (×4): 100 mg via ORAL
  Filled 2018-04-18 (×4): qty 1

## 2018-04-18 MED ORDER — LIDOCAINE HCL (PF) 1 % IJ SOLN
INTRAMUSCULAR | Status: AC
  Filled 2018-04-18: qty 30

## 2018-04-18 MED ORDER — OXYTOCIN 10 UNIT/ML IJ SOLN
INTRAMUSCULAR | Status: AC
Start: 2018-04-18 — End: 2018-04-19
  Filled 2018-04-18: qty 2

## 2018-04-18 MED ORDER — ONDANSETRON HCL 4 MG/2ML IJ SOLN: 4.0000 mg | INTRAMUSCULAR | Status: DC | PRN

## 2018-04-18 MED ORDER — DIBUCAINE 1 % RE OINT: 1.0000 "application " | TOPICAL_OINTMENT | RECTAL | Status: DC | PRN

## 2018-04-18 MED ORDER — OXYTOCIN BOLUS FROM INFUSION
500.0000 mL | Freq: Once | INTRAVENOUS | Status: AC
  Administered 2018-04-18: 500 mL via INTRAVENOUS

## 2018-04-18 MED ORDER — OXYTOCIN 40 UNITS IN LACTATED RINGERS INFUSION - SIMPLE MED
1.0000 m[IU]/min | INTRAVENOUS | Status: DC
  Administered 2018-04-18: 2 m[IU]/min via INTRAVENOUS

## 2018-04-18 MED ORDER — BUTORPHANOL TARTRATE 1 MG/ML IJ SOLN: 1.0000 mg | INTRAMUSCULAR | Status: DC | PRN

## 2018-04-18 MED ORDER — BENZOCAINE-MENTHOL 20-0.5 % EX AERO
1.0000 "application " | INHALATION_SPRAY | CUTANEOUS | Status: DC | PRN
  Administered 2018-04-18: 1 via TOPICAL
  Filled 2018-04-18: qty 56

## 2018-04-18 MED ORDER — TERBUTALINE SULFATE 1 MG/ML IJ SOLN: 0.2500 mg | Freq: Once | INTRAMUSCULAR | Status: DC | PRN

## 2018-04-18 MED ORDER — IBUPROFEN 600 MG PO TABS
600.0000 mg | ORAL_TABLET | Freq: Four times a day (QID) | ORAL | Status: DC
  Administered 2018-04-18: 600 mg via ORAL

## 2018-04-18 MED ORDER — MISOPROSTOL 200 MCG PO TABS
ORAL_TABLET | ORAL | Status: AC
  Filled 2018-04-18: qty 4

## 2018-04-18 MED ORDER — LIDOCAINE HCL (PF) 1 % IJ SOLN: 30.0000 mL | INTRAMUSCULAR | Status: DC | PRN

## 2018-04-18 MED ORDER — SODIUM CHLORIDE 0.9 % IV SOLN
1.0000 g | INTRAVENOUS | Status: DC
  Administered 2018-04-18: 1 g via INTRAVENOUS
  Filled 2018-04-18 (×4): qty 1000

## 2018-04-18 MED ORDER — ACETAMINOPHEN 500 MG PO TABS: 1000.0000 mg | ORAL_TABLET | Freq: Four times a day (QID) | ORAL | Status: DC | PRN

## 2018-04-18 MED ORDER — PRENATAL MULTIVITAMIN CH
1.0000 | ORAL_TABLET | Freq: Every day | ORAL | Status: DC
  Administered 2018-04-19: 1 via ORAL
  Filled 2018-04-18: qty 1

## 2018-04-18 MED ORDER — IBUPROFEN 600 MG PO TABS
ORAL_TABLET | ORAL | Status: AC
  Filled 2018-04-18: qty 1

## 2018-04-18 MED ORDER — COCONUT OIL OIL: 1.0000 "application " | TOPICAL_OIL | Status: DC | PRN

## 2018-04-18 MED ORDER — OXYTOCIN 40 UNITS IN LACTATED RINGERS INFUSION - SIMPLE MED
2.5000 [IU]/h | INTRAVENOUS | Status: DC
  Administered 2018-04-18: 2.5 [IU]/h via INTRAVENOUS
  Filled 2018-04-18: qty 1000

## 2018-04-18 NOTE — Progress Notes (Signed)
Intrapartum progress note  S:  Comfortable, says contractions have died down.  Been walking in hallways  O:  BP 98/86 (BP Location: Left Arm)   Pulse 87   Temp 98.1 F (36.7 C) (Oral)   Resp 18   Ht  (1.702 m)   Wt 98.9 kg (218 lb)   LMP 07/11/2017 (LMP Unknown)   BMI 34.14 kg/m   FHT: 130 mod + accels no decels TOCO: q62min SVE: 6 / 80 / -1 AROM'd for minimal clear fluid.  Fetal head well applied after AROM  A/P:  22you [id number] @ 40 + 1 d with labor  1. Augment labor with pitocin after AROM 2. IUP: category 1 3.  GBS:  Has had adequate coverage, continue ampicillin 1g q4h 4. Anticipate vaginal delivery  ----- Ranae Plumber, MD Attending Obstetrician and Gynecologist [facility], Department of OB/GYN [facility]

## 2018-04-18 NOTE — H&P (Signed)
OB History & Physical   History of Present Illness:  Chief Complaint:   HPI:  Betty Chavez is a 22 y.o. [phone] female at [redacted]w[redacted]d dated by 29w ultrasound, confirmed w 34wk [place] Estimated LMP: 07/11/17 Estimated Date of Delivery: 04/17/18   She presents to L&D for   +FM, +CTX, no LOF, no VB  Pregnancy Issues: 1. Late to care (29wks) 2. Short interval pregnancy, last delivery <59mos ago 3. Varicella non-immune  Maternal Medical History:   Past Medical History:  Diagnosis Date  . Medical history non-contributory     Past Surgical History:  Procedure Laterality Date  . NO PAST SURGERIES      No Known Allergies  Prior to Admission medications   Medication Sig Start Date End Date Taking? Authorizing Provider  benzocaine-Menthol (DERMOPLAST) 20-0.5 % AERO Apply 1 application topically as needed for irritation (perineal discomfort). Patient not taking: Reported on 04/18/2018 05/03/17   Schermerhorn, Ihor Austin, MD  ibuprofen (ADVIL,MOTRIN) 600 MG tablet Take 1 tablet (600 mg total) by mouth every 6 (six) hours. Patient not taking: Reported on 04/18/2018 05/03/17   Schermerhorn, Ihor Austin, MD     Prenatal care site: [facility]  Social History: She  reports that she has never smoked. She has never used smokeless tobacco. She reports that she has current or past drug history. Drug: Marijuana. She reports that she does not drink alcohol.  Family History: family history is not on file.   Review of Systems: A full review of systems was performed and negative except as noted in the HPI.     Physical Exam:  Vital Signs: BP 128/72 (BP Location: Left Arm)   Pulse 90   Temp 98.6 F (37 C) (Oral)   Resp 18   Ht  (1.702 m)   Wt 98.9 kg (218 lb)   BMI 34.14 kg/m  General: no acute distress.  HEENT: normocephalic, atraumatic Heart: regular rate & rhythm.  No murmurs/rubs/gallops Lungs: clear to auscultation bilaterally, normal respiratory effort Abdomen:  soft, gravid, non-tender;  EFW: 7.13 Pelvic:   External: Normal external female genitalia  Cervix: Dilation: 5.5 / Effacement (%): 70, 80 / Station: -2    Extremities: non-tender, symmetric, no edema bilaterally.  DTRs: 2+  Neurologic: Alert & oriented x 3.    No results found for this or any previous visit (from the past 24 hour(s)).  Pertinent Results:  Prenatal Labs: Blood type/Rh O+  Antibody screen neg  Rubella Immune  Varicella Non-Immune  RPR NR  HBsAg Neg  HIV NR  GC neg  Chlamydia neg  Genetic screening Not done  1 hour GTT 71  3 hour GTT   GBS positive   FHT: 130 mod + Accels no decels TOCO: q2-64min SVE:  6/75/-2 intact, middle, soft   Cephalic by leopolds   Assessment:  Betty Chavez is a 22 y.o. [id number] female at [redacted]w[redacted]d with labor, GBS+.   Plan:  1. Admit to Labor & Delivery 2. CBC, T&S, Clrs, IVF 3. GBS  Pos - ampicillin ordered 4. Consents obtained. 5. Continuous efm/toco 6. Will AROM after adequate coverage of antibiotics; may not attain coverage with rapid delivery history.  ----- Ranae Plumber, MD Attending Obstetrician and Gynecologist [facility], Department of OB/GYN [facility]

## 2018-04-18 NOTE — OB Triage Note (Signed)
Ctx started yesterday evening becoming regular and more painful this am approx 0930. Denies LOF and vaginal bleeding.Reports good fetal movement. Elaina Hoops

## 2018-04-18 NOTE — Discharge Summary (Addendum)
Obstetrical Discharge Summary  Patient Name: Betty Chavez DOB: 08/23/1996 MRN: [id number]  Date of Admission: 04/18/2018 Date of Delivery: 04/18/18 Delivered by: Ranae Plumber, MD Date of Discharge: 04/20/18 Primary OB: [facility] [id number] last menstrual period was 07/11/2017 (lmp unknown). EDC Estimated Date of Delivery: 04/17/18 Gestational Age at Delivery: [redacted]w[redacted]d   Antepartum complications:    1. Late to care (29wks) 2. Short interval pregnancy, last delivery <91mos ago 3. Varicella non-immune  Admitting Diagnosis: labor, GBS+ Secondary Diagnosis: Patient Active Problem List   Diagnosis Date Noted  . Labor and delivery indication for care or intervention 04/18/2018  . Uterine contractions during pregnancy 05/02/2017    Augmentation: AROM and Pitocin Complications: None Intrapartum complications/course: Mom presented to L&D with labor, given ampicillin x >4h for GBS prophylaxis. Augmented with AROM and pitocin. Progressed to complete, second stage: 2 pushes.  delivery of fetal head with restitution to ROT.   Anterior then posterior shoulders delivered without difficulty.  Baby placed on mom's chest, and attended to by peds.   Cord was then clamped and cut by FOB.  Placenta spontaneously delivered, intact.   IV pitocin given for hemorrhage prophylaxis. Date of Delivery: 04/18/18 Delivered By: Leeroy Bock Ward Delivery Type: spontaneous vaginal delivery Anesthesia: Nitrous oxide Placenta: spontaneous Laceration: none Episiotomy: none Newborn Data: Live born female "Semyah" Birth Weight:  3890 APGAR: 8, 9   Newborn Delivery   Birth date/time:  04/18/2018 18:58:00 Delivery type:  Vaginal, Spontaneous     Postpartum Procedures: none  Post partum course: uncomplicated  Patient had an uncomplicated postpartum course.  By time of discharge on PPD#2, her pain was controlled on oral pain medications; she had appropriate lochia and was ambulating, voiding  without difficulty and tolerating regular diet.  She was deemed stable for discharge to home.    Discharge Physical Exam: 04/20/2018  BP 127/78 (BP Location: Right Arm)   Pulse 61   Temp 98 F (36.7 C)   Resp 18   Ht  (1.702 m)   Wt 218 lb (98.9 kg)   LMP 07/11/2017 (LMP Unknown)   SpO2 100%   Breastfeeding? Unknown   BMI 34.14 kg/m   General: NAD CV: RRR Pulm: CTABL, nl effort ABD: s/nd/nt, fundus firm and below the umbilicus Lochia: small DVT Evaluation: LE non-ttp, no evidence of DVT on exam.  Hemoglobin  Date Value Ref Range Status  04/19/2018 10.2 (L) 12.0 - 16.0 g/dL Final   HGB  Date Value Ref Range Status  08/02/2014 10.9 (L) 12.0 - 16.0 g/dL Final   HCT  Date Value Ref Range Status  04/19/2018 30.9 (L) 35.0 - 47.0 % Final  08/05/2014 30.8 (L) 35.0 - 47.0 % Final     Disposition: stable, discharge to home. Baby Feeding:formula Baby Disposition: baby with stay and mother to room in with the baby   Rh Immune globulin given: n/a Varicella vaccine given: postpartum 04/20/2018  Rubella vaccine given: no Tdap vaccine given in AP or PP setting: AP Flu vaccine given in AP or PP setting: not applicable  Contraception:Nexplanon   Prenatal Labs:   Blood type/Rh O+  Antibody screen neg  Rubella Immune  Varicella Non-Immune  RPR NR  HBsAg Neg  HIV NR  GC neg  Chlamydia neg  Genetic screening Not done  1 hour GTT 71  3 hour GTT   GBS positive      Plan:  Hope Budds was discharged to home in good condition. Follow-up appointment with Dr. Elesa Massed  in 6 weeks.  Discharge Medications: Allergies as of 04/20/2018   No Known Allergies     Medication List    STOP taking these medications   benzocaine-Menthol 20-0.5 % Aero Commonly known as:  DERMOPLAST     TAKE these medications   ibuprofen 600 MG tablet Commonly known as:  ADVIL,MOTRIN Take 1 tablet (600 mg total) by mouth every 6 (six) hours.       Follow-up Information    Ward,  Elenora Fender, MD. Schedule an appointment as soon as possible for a visit in 6 week(s).   Specialty:  Obstetrics and Gynecology Why:  for PP visit and Nexplanon insertion Contact information: [address] [place] [address] [phone]           Signed: Randa Ngo, CNM 04/20/2018 9:33 AM

## 2018-04-19 LAB — CBC
HCT: 30.9 % — ABNORMAL LOW (ref 35.0–47.0)
Hemoglobin: 10.2 g/dL — ABNORMAL LOW (ref 12.0–16.0)
MCH: 26.2 pg (ref 26.0–34.0)
MCHC: 32.9 g/dL (ref 32.0–36.0)
MCV: 79.6 fL — ABNORMAL LOW (ref 80.0–100.0)
Platelets: 228 10*3/uL (ref 150–440)
RBC: 3.88 MIL/uL (ref 3.80–5.20)
RDW: 13.5 % (ref 11.5–14.5)
WBC: 9.6 10*3/uL (ref 3.6–11.0)

## 2018-04-19 LAB — RPR: RPR: NONREACTIVE

## 2018-04-19 MED ORDER — IBUPROFEN 600 MG PO TABS: 600.0000 mg | ORAL_TABLET | Freq: Four times a day (QID) | ORAL | 0 refills | Status: DC

## 2018-04-19 MED ORDER — VARICELLA VIRUS VACCINE LIVE 1350 PFU/0.5ML IJ SUSR
0.5000 mL | Freq: Once | INTRAMUSCULAR | Status: AC
  Administered 2018-04-20: 0.5 mL via SUBCUTANEOUS
  Filled 2018-04-19 (×2): qty 0.5

## 2018-04-19 MED ORDER — IBUPROFEN 600 MG PO TABS
600.0000 mg | ORAL_TABLET | Freq: Four times a day (QID) | ORAL | Status: DC
  Administered 2018-04-19 – 2018-04-20 (×6): 600 mg via ORAL
  Filled 2018-04-19 (×6): qty 1

## 2018-04-19 NOTE — Plan of Care (Signed)
  Problem: Education: Goal: Knowledge of condition will improve Outcome: Progressing   Problem: Activity: Goal: Will verbalize the importance of balancing activity with adequate rest periods Outcome: Progressing Goal: Ability to tolerate increased activity will improve Outcome: Progressing   Problem: Coping: Goal: Ability to identify and utilize available resources and services will improve Outcome: Progressing   Problem: Life Cycle: Goal: Chance of risk for complications during the postpartum period will decrease Outcome: Progressing   Problem: Role Relationship: Goal: Ability to demonstrate positive interaction with newborn will improve Outcome: Progressing   

## 2018-04-19 NOTE — Progress Notes (Signed)
Dr. Elesa Massed notified that Pt. Fundus is 2 above umbilicus and firm. Moderate lochial flow without clots and Pt. Has voided. No new orders received.

## 2018-04-19 NOTE — Progress Notes (Signed)
Provided period of purple cry video for mother to watch. Answered questions regarding the video. Mother verbalized understanding. Provided copy of the dvd and hat for baby made by volunteers.

## 2018-04-20 NOTE — Progress Notes (Signed)
Post Partum Day 2 Subjective: Doing well, no complaints.  Tolerating regular diet, pain with PO meds, voiding and ambulating without difficulty.  No CP SOB Fever,Chills, N/V or leg pain; denies nipple or breast pain, no HA change of vision, RUQ/epigastric pain  Objective: BP 127/78 (BP Location: Right Arm)   Pulse 61   Temp 98 F (36.7 C)   Resp 18   Ht  (1.702 m)   Wt 218 lb (98.9 kg)   LMP 07/11/2017 (LMP Unknown)   SpO2 100%   Breastfeeding? Unknown   BMI 34.14 kg/m    Physical Exam:  General: NAD Breasts: soft/nontender CV: RRR Pulm: nl effort, CTABL Abdomen: soft, NT, BS x 4 Perineum: minimal edema, intact Lochia: small Uterine Fundus: fundus firm and 2 fb below umbilicus DVT Evaluation: no cords, ttp LEs   Recent Labs    04/18/18 1256 04/19/18 0431  HGB 10.8* 10.2*  HCT 32.2* 30.9*  WBC 8.9 9.6  PLT 260 228    Assessment/Plan: 22 y.o. [id number] postpartum day # 2  - Continue routine PP care- DC home today -  encouraged snug fitting bra and cabbage leaves for bottlefeeding.  - Discussed contraceptive options including implant, IUDs hormonal and non-hormonal, injection, pills/ring/patch, condoms, and NFP- pt desires Nexplanon at [facility] visit  - Acute blood loss anemia - hemodynamically stable and asymptomatic; continue PNV with iron at home for 6 wks PP - Immunization status: Needs varicella prior to DC   Disposition: Does desire Dc home today.     Darden Flemister A, CNM 04/20/2018  9:25 AM

## 2018-04-20 NOTE — Progress Notes (Signed)
Provided and reviewed discharge paperwork and prescriptions. Verified understanding by use of teach back method. Pt verbalized understating as well. Pt to call and make 6 weeks follow up appointment. Awaiting family member to come pick up mother/baby and transport home.

## 2019-08-25 LAB — OB RESULTS CONSOLE VARICELLA ZOSTER ANTIBODY, IGG
Varicella: IMMUNE
Varicella: NON-IMMUNE/NOT IMMUNE

## 2019-08-25 LAB — OB RESULTS CONSOLE GC/CHLAMYDIA
Chlamydia: NEGATIVE
Gonorrhea: NEGATIVE

## 2019-08-25 LAB — OB RESULTS CONSOLE RUBELLA ANTIBODY, IGM: Rubella: IMMUNE

## 2019-08-25 LAB — OB RESULTS CONSOLE RPR: RPR: NONREACTIVE

## 2019-08-25 LAB — OB RESULTS CONSOLE HIV ANTIBODY (ROUTINE TESTING): HIV: NONREACTIVE

## 2019-08-25 LAB — OB RESULTS CONSOLE HEPATITIS B SURFACE ANTIGEN: Hepatitis B Surface Ag: NEGATIVE

## 2019-08-30 ENCOUNTER — Other Ambulatory Visit (HOSPITAL_COMMUNITY): Payer: Self-pay | Admitting: Family Medicine

## 2019-08-30 ENCOUNTER — Other Ambulatory Visit: Payer: Self-pay | Admitting: Family Medicine

## 2019-08-30 DIAGNOSIS — Z3492 Encounter for supervision of normal pregnancy, unspecified, second trimester: Secondary | ICD-10-CM

## 2019-09-02 ENCOUNTER — Other Ambulatory Visit: Payer: Self-pay

## 2019-09-02 ENCOUNTER — Ambulatory Visit
Admission: RE | Admit: 2019-09-02 | Discharge: 2019-09-02 | Disposition: A | Discharge status: Home / Self Care | Payer: Self-pay | Source: Ambulatory Visit | Attending: Family Medicine | Admitting: Family Medicine

## 2019-09-02 DIAGNOSIS — Z3492 Encounter for supervision of normal pregnancy, unspecified, second trimester: Secondary | ICD-10-CM | POA: Insufficient documentation

## 2019-12-09 NOTE — L&D Delivery Note (Signed)
Delivery Summary for Betty Chavez  Labor Events:   Preterm labor: No data found  Rupture date: 12/19/2019  Rupture time: 10:08 AM  Rupture type: Spontaneous  Fluid Color: Moderate Meconium  Induction: No data found  Augmentation: No data found  Complications: No data found  Cervical ripening: No data found No data found   No data found     Delivery:   Episiotomy: No data found  Lacerations: No data found  Repair suture: No data found  Repair # of packets: No data found  Blood loss (ml): 200   Information for the patient's newborn:  Betty Chavez, Betty Chavez [[id number]]    Delivery 12/19/2019 10:12 AM by  Vaginal, Spontaneous Sex:  female Gestational Age: [redacted]w[redacted]d Delivery Clinician:   Living?:         APGARS  One minute Five minutes Ten minutes  Skin color:        Heart rate:        Grimace:        Muscle tone:        Breathing:        Totals: 8  9      Presentation/position:      Resuscitation:   Cord information:    Disposition of cord blood:     Blood gases sent?  Complications:   Placenta: Delivered:       appearance Newborn Measurements: Weight: 9 lb 3.1 oz (4170 g)  Height: 20.87"  Head circumference:    Chest circumference:    Other providers:    Additional  information: Forceps:   Vacuum:   Breech:   Observed anomalies        Delivery Note At 10:12 AM a viable and healthy female was delivered precipitously via Vaginal, Spontaneous (Presentation: Vertex; LOA position).  APGAR: 8, 9; weight 9 lb 3.1 oz (4170 g).   Placenta status: Spontaneous, Intact.  Cord: 3 vessels with the following complications: none.  Cord pH: not obtained.  Copious amount of moderate meconium stained fluid (~ 1200 cc) noted at time of delivery.  Delayed cord clamping observed.  She was given an injection of 10 mg of IM Pitocin due to not rapid delivery without IV access.   Anesthesia: None Episiotomy: None Lacerations: None Suture Repair: None Est. Blood Loss (mL): 200  Mom to  postpartum.  Baby to Couplet care / Skin to Skin.  Hildred Laser, MD 12/19/2019, 11:01 AM

## 2019-12-19 ENCOUNTER — Other Ambulatory Visit: Payer: Self-pay

## 2019-12-19 ENCOUNTER — Inpatient Hospital Stay
Admission: EM | Admit: 2019-12-19 | Discharge: 2019-12-21 | DRG: 806 | Disposition: A | Discharge status: Home / Self Care | Payer: Medicaid Other | Attending: Obstetrics and Gynecology | Admitting: Obstetrics and Gynecology

## 2019-12-19 DIAGNOSIS — O9902 Anemia complicating childbirth: Secondary | ICD-10-CM | POA: Diagnosis present

## 2019-12-19 DIAGNOSIS — O99324 Drug use complicating childbirth: Secondary | ICD-10-CM | POA: Diagnosis present

## 2019-12-19 DIAGNOSIS — Z20822 Contact with and (suspected) exposure to covid-19: Secondary | ICD-10-CM | POA: Diagnosis present

## 2019-12-19 DIAGNOSIS — O480 Post-term pregnancy: Secondary | ICD-10-CM

## 2019-12-19 DIAGNOSIS — O0932 Supervision of pregnancy with insufficient antenatal care, second trimester: Secondary | ICD-10-CM

## 2019-12-19 DIAGNOSIS — Z3A40 40 weeks gestation of pregnancy: Secondary | ICD-10-CM

## 2019-12-19 DIAGNOSIS — F1290 Cannabis use, unspecified, uncomplicated: Secondary | ICD-10-CM | POA: Diagnosis present

## 2019-12-19 DIAGNOSIS — D649 Anemia, unspecified: Secondary | ICD-10-CM | POA: Diagnosis present

## 2019-12-19 DIAGNOSIS — Z3493 Encounter for supervision of normal pregnancy, unspecified, third trimester: Secondary | ICD-10-CM

## 2019-12-19 DIAGNOSIS — O26893 Other specified pregnancy related conditions, third trimester: Secondary | ICD-10-CM | POA: Diagnosis present

## 2019-12-19 LAB — CBC
HCT: 34 % — ABNORMAL LOW (ref 36.0–46.0)
Hemoglobin: 10.9 g/dL — ABNORMAL LOW (ref 12.0–15.0)
MCH: 25.5 pg — ABNORMAL LOW (ref 26.0–34.0)
MCHC: 32.1 g/dL (ref 30.0–36.0)
MCV: 79.6 fL — ABNORMAL LOW (ref 80.0–100.0)
Platelets: 218 K/uL (ref 150–400)
RBC: 4.27 MIL/uL (ref 3.87–5.11)
RDW: 14.3 % (ref 11.5–15.5)
WBC: 8.4 K/uL (ref 4.0–10.5)
nRBC: 0 % (ref 0.0–0.2)

## 2019-12-19 LAB — URINE DRUG SCREEN, QUALITATIVE (ARMC ONLY)
Amphetamines, Ur Screen: NOT DETECTED
Barbiturates, Ur Screen: NOT DETECTED
Benzodiazepine, Ur Scrn: NOT DETECTED
Cannabinoid 50 Ng, Ur ~~LOC~~: POSITIVE — AB
Cocaine Metabolite,Ur ~~LOC~~: NOT DETECTED
MDMA (Ecstasy)Ur Screen: NOT DETECTED
Methadone Scn, Ur: NOT DETECTED
Opiate, Ur Screen: NOT DETECTED
Phencyclidine (PCP) Ur S: NOT DETECTED
Tricyclic, Ur Screen: NOT DETECTED

## 2019-12-19 LAB — SARS CORONAVIRUS 2 (TAT 6-24 HRS): SARS Coronavirus 2: NEGATIVE

## 2019-12-19 LAB — TYPE AND SCREEN
ABO/RH(D): O POS
Antibody Screen: NEGATIVE

## 2019-12-19 MED ORDER — DIPHENHYDRAMINE HCL 25 MG PO CAPS: 25.0000 mg | ORAL_CAPSULE | Freq: Four times a day (QID) | ORAL | Status: DC | PRN

## 2019-12-19 MED ORDER — LIDOCAINE HCL (PF) 1 % IJ SOLN
INTRAMUSCULAR | Status: AC
  Filled 2019-12-19: qty 30

## 2019-12-19 MED ORDER — OXYCODONE-ACETAMINOPHEN 5-325 MG PO TABS: 1.0000 | ORAL_TABLET | ORAL | Status: DC | PRN

## 2019-12-19 MED ORDER — ONDANSETRON HCL 4 MG PO TABS: 4.0000 mg | ORAL_TABLET | ORAL | Status: DC | PRN

## 2019-12-19 MED ORDER — WITCH HAZEL-GLYCERIN EX PADS: 1.0000 "application " | MEDICATED_PAD | CUTANEOUS | Status: DC | PRN

## 2019-12-19 MED ORDER — ZOLPIDEM TARTRATE 5 MG PO TABS: 5.0000 mg | ORAL_TABLET | Freq: Every evening | ORAL | Status: DC | PRN

## 2019-12-19 MED ORDER — IBUPROFEN 800 MG PO TABS
800.0000 mg | ORAL_TABLET | Freq: Four times a day (QID) | ORAL | Status: DC
  Administered 2019-12-19 – 2019-12-20 (×6): 800 mg via ORAL
  Filled 2019-12-19 (×6): qty 1

## 2019-12-19 MED ORDER — COCONUT OIL OIL: 1.0000 "application " | TOPICAL_OIL | Status: DC | PRN

## 2019-12-19 MED ORDER — ONDANSETRON HCL 4 MG/2ML IJ SOLN: 4.0000 mg | INTRAMUSCULAR | Status: DC | PRN

## 2019-12-19 MED ORDER — PRENATAL MULTIVITAMIN CH
1.0000 | ORAL_TABLET | Freq: Every day | ORAL | Status: DC
  Administered 2019-12-20: 14:00:00 1 via ORAL
  Filled 2019-12-19: qty 1

## 2019-12-19 MED ORDER — ONDANSETRON HCL 4 MG/2ML IJ SOLN: 4.0000 mg | Freq: Four times a day (QID) | INTRAMUSCULAR | Status: DC | PRN

## 2019-12-19 MED ORDER — BENZOCAINE-MENTHOL 20-0.5 % EX AERO
1.0000 "application " | INHALATION_SPRAY | CUTANEOUS | Status: DC | PRN
  Administered 2019-12-19: 1 via TOPICAL
  Filled 2019-12-19: qty 56

## 2019-12-19 MED ORDER — ACETAMINOPHEN 325 MG PO TABS
650.0000 mg | ORAL_TABLET | ORAL | Status: DC | PRN
  Administered 2019-12-19: 12:00:00 650 mg via ORAL
  Filled 2019-12-19: qty 2

## 2019-12-19 MED ORDER — OXYCODONE-ACETAMINOPHEN 5-325 MG PO TABS: 2.0000 | ORAL_TABLET | ORAL | Status: DC | PRN

## 2019-12-19 MED ORDER — SENNOSIDES-DOCUSATE SODIUM 8.6-50 MG PO TABS
2.0000 | ORAL_TABLET | ORAL | Status: DC
  Administered 2019-12-19 – 2019-12-20 (×2): 2 via ORAL
  Filled 2019-12-19 (×2): qty 2

## 2019-12-19 MED ORDER — OXYTOCIN 10 UNIT/ML IJ SOLN
10.0000 [IU] | Freq: Once | INTRAMUSCULAR | Status: AC
  Administered 2019-12-19: 10:00:00 10 [IU] via INTRAMUSCULAR

## 2019-12-19 MED ORDER — DIBUCAINE (PERIANAL) 1 % EX OINT: 1.0000 "application " | TOPICAL_OINTMENT | CUTANEOUS | Status: DC | PRN

## 2019-12-19 MED ORDER — MISOPROSTOL 200 MCG PO TABS
ORAL_TABLET | ORAL | Status: AC
  Filled 2019-12-19: qty 4

## 2019-12-19 MED ORDER — SIMETHICONE 80 MG PO CHEW: 80.0000 mg | CHEWABLE_TABLET | ORAL | Status: DC | PRN

## 2019-12-19 MED ORDER — MEASLES, MUMPS & RUBELLA VAC IJ SOLR
0.5000 mL | Freq: Once | INTRAMUSCULAR | Status: DC
  Filled 2019-12-19: qty 0.5

## 2019-12-19 MED ORDER — ACETAMINOPHEN 325 MG PO TABS: 650.0000 mg | ORAL_TABLET | ORAL | Status: DC | PRN

## 2019-12-19 NOTE — H&P (Signed)
Obstetric History and Physical  Betty Chavez is a 24 y.o. [phone] with IUP 40.[redacted] weeks gestation based on 25 week ultrasound (with unsure LMP of 04/05/2019), for complaints of contractions and feeling the urge to push.  Patient reports that contractions began around 0840 this morning and were regular, every 2-4 minutes. She denies vaginal bleeding, and reports intact membranes, with active fetal movement.    Prenatal Course Source of Care: [facility] with onset of care at ~24-25 weeks Pregnancy complications or risks: Patient Active Problem List   Diagnosis Date Noted  . Pregnancy 12/19/2019  . Labor, precipitous 12/19/2019  . Labor and delivery indication for care or intervention 04/18/2018  . Uterine contractions during pregnancy 05/02/2017   She plans to bottle feed She desires Nexplanon vs IUD for postpartum contraception.   Prenatal labs and studies: ABO, Rh:  O Positive (from prior pregnancy labs) Antibody:  Negative Rubella:   RPR:    HBsAg:    HIV:    GBS:  1 hr Glucola  Unknown (normal per patient Genetic screening not done Anatomy US normal (09/02/2019)   Past Medical History:  Diagnosis Date  . Medical history non-contributory     Past Surgical History:  Procedure Laterality Date  . NO PAST SURGERIES      OB History  Gravida Para Term Preterm AB Living  5 4 3  0 1 4  SAB TAB Ectopic Multiple Live Births  1 0 0 0 3    # Outcome Date GA Lbr Len/2nd Weight Sex Delivery Anes PTL Lv  5 Para 12/19/19  / 00:10 4170 g F Vag-Spont None  LIV  4 Term 04/18/18 [redacted]w[redacted]d  3890 g F Vag-Spont Other  LIV  3 Term 08/03/14        LIV  2 SAB           1 Term             Social History   Socioeconomic History  . Marital status: Single    Spouse name: Not on file  . Number of children: Not on file  . Years of education: Not on file  . Highest education level: Not on file  Occupational History  . Not on file  Tobacco Use  . Smoking status: Never  Smoker  . Smokeless tobacco: Never Used  Substance and Sexual Activity  . Alcohol use: No  . Drug use: Yes    Types: Marijuana  . Sexual activity: Not Currently    Birth control/protection: Implant, Injection  Other Topics Concern  . Not on file  Social History Narrative  . Not on file   Social Determinants of Health   Financial Resource Strain:   . Difficulty of Paying Living Expenses: Not on file  Food Insecurity:   . Worried About Charity fundraiser in the Last Year: Not on file  . Ran Out of Food in the Last Year: Not on file  Transportation Needs:   . Lack of Transportation (Medical): Not on file  . Lack of Transportation (Non-Medical): Not on file  Physical Activity:   . Days of Exercise per Week: Not on file  . Minutes of Exercise per Session: Not on file  Stress:   . Feeling of Stress : Not on file  Social Connections:   . Frequency of Communication with Friends and Family: Not on file  . Frequency of Social Gatherings with Friends and Family: Not on file  . Attends Religious Services: Not  on file  . Active Member of Clubs or Organizations: Not on file  . Attends Banker Meetings: Not on file  . Marital Status: Not on file    History reviewed. No pertinent family history.  Medications Prior to Admission  Medication Sig Dispense Refill Last Dose  . ibuprofen (ADVIL,MOTRIN) 600 MG tablet Take 1 tablet (600 mg total) by mouth every 6 (six) hours. (Patient not taking: Reported on 12/19/2019) 30 tablet 0 Not Taking at Unknown time    No Known Allergies  Review of Systems: Negative except for what is mentioned in HPI.  Physical Exam: BP (!) 116/59   Pulse 74   Temp 98 F (36.7 C) (Oral)   Resp 20   Ht 5\' 7"  (1.702 m)   Wt 92.1 kg   Breastfeeding Unknown   BMI 31.79 kg/m  CONSTITUTIONAL: Well-developed, well-nourished female in mild distress.  HENT:  Normocephalic, atraumatic, External right and left ear normal. Oropharynx is clear and  moist EYES: Conjunctivae and EOM are normal. Pupils are equal, round, and reactive to light. No scleral icterus.  NECK: Normal range of motion, supple, no masses SKIN: Skin is warm and dry. No rash noted. Not diaphoretic. No erythema. No pallor. NEUROLOGIC: Alert and oriented to person, place, and time. Normal reflexes, muscle tone coordination. No cranial nerve deficit noted. PSYCHIATRIC: Normal mood and affect. Normal behavior. Normal judgment and thought content. CARDIOVASCULAR: Normal heart rate noted, regular rhythm RESPIRATORY: Effort and breath sounds normal, no problems with respiration noted ABDOMEN: Soft, nontender, nondistended, gravid. MUSCULOSKELETAL: Normal range of motion. No edema and no tenderness. 2+ distal pulses.  Cervical Exam: Dilatation 10cm   Effacement 100%   Station +1 with bulging membranes  Presentation: cephalic FHT:  Baseline rate 140 bpm   Variability moderate  Accelerations present   Decelerations none Contractions: Every 2-3 mins   Pertinent Labs/Studies:   No results found for this or any previous visit (from the past 24 hour(s)).  Assessment : Betty Chavez is a 24 y.o. 25 at Unknown being admitted for advanced labor at term. Insufficient (late entry to) [facility].   Plan: Labor: Expectant management for precipitous labor. Admission labs ordered. Will attempt to get records from [facility].  FWB: Reassuring fetal heart tracing.  GBS unknown at term. Will not administer antibiotics at this time.  Delivery plan: Anticipate for vaginal delivery soon.     [facility], MD [facility]

## 2019-12-20 LAB — CBC
HCT: 33 % — ABNORMAL LOW (ref 36.0–46.0)
Hemoglobin: 10.7 g/dL — ABNORMAL LOW (ref 12.0–15.0)
MCH: 25.5 pg — ABNORMAL LOW (ref 26.0–34.0)
MCHC: 32.4 g/dL (ref 30.0–36.0)
MCV: 78.8 fL — ABNORMAL LOW (ref 80.0–100.0)
Platelets: 222 10*3/uL (ref 150–400)
RBC: 4.19 MIL/uL (ref 3.87–5.11)
RDW: 14.3 % (ref 11.5–15.5)
WBC: 9.3 10*3/uL (ref 4.0–10.5)
nRBC: 0 % (ref 0.0–0.2)

## 2019-12-20 LAB — RPR: RPR Ser Ql: NONREACTIVE

## 2019-12-20 MED ORDER — VARICELLA VIRUS VACCINE LIVE 1350 PFU/0.5ML IJ SUSR
0.5000 mL | Freq: Once | INTRAMUSCULAR | Status: DC
  Filled 2019-12-20: qty 0.5

## 2019-12-20 MED ORDER — IBUPROFEN 800 MG PO TABS: 800.0000 mg | ORAL_TABLET | Freq: Three times a day (TID) | ORAL | 1 refills | Status: DC | PRN

## 2019-12-20 MED ORDER — PRENATAL PLUS IRON 29-1 MG PO TABS: 1.0000 | ORAL_TABLET | Freq: Every day | ORAL | 1 refills | Status: DC

## 2019-12-20 MED ORDER — IBUPROFEN 800 MG PO TABS
800.0000 mg | ORAL_TABLET | Freq: Four times a day (QID) | ORAL | Status: DC
  Administered 2019-12-21: 03:00:00 800 mg via ORAL
  Filled 2019-12-20: qty 1

## 2019-12-20 NOTE — Progress Notes (Signed)
CLINICAL SOCIAL WORK MATERNAL/CHILD NOTE  Patient Details  Name: Betty Chavez  MRN: [id number]  Date of Birth: 12/19/2020  Date: 12/20/2019  Clinical Social Worker Initiating Note: Thea Gist, RNCM Date/Time: Initiated: 12/20/19/0900  Child's Name: Betty Chavez  Biological Parents: Mother, Father  RNCM assessed mother at bedside. Mother is holding baby Betty in her arms and acting appropriately. Mother reports to feeling pretty well today. Mother reports father of the baby's name is Tamile and he is involved but does not provide much assistance. Patient reports that she lives alone but her mother does assist her. Discussed with patient any past issues with mental health. She does report that she has been treated for Depression in the past and is scheduled to be seen at [facility]. 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 Patient does admit to Marijuana use and does report she has an open CPS case with all her children. Mother reports that she does have transportation, and has all the necessary items at home. RNCM placed call and left VM for CPS report.  Need for Interpreter: None  Reason for Referral: Current Substance Use/Substance Use During Pregnancy  Address: [address]  [place] [address]  Phone number: [phone] (home)  Additional phone number:  Household Members/Support Persons (HM/SP):    HM/SP Name Relationship DOB or Age   HM/SP -1      HM/SP -2      HM/SP -3      HM/SP -4      HM/SP -5      HM/SP -6      HM/SP -7      HM/SP -8      Natural Supports (not living in the home): Children, Parent  Professional Supports: Case Research officer, political party  Employment:  Type of Work:  Education: Engineer, agricultural  Homebound arranged:  Surveyor, quantity Resources: Medicaid  Other Resources: Sales executive , WIC  Cultural/Religious Considerations Which May Impact Care:  Strengths: Ability to meet basic needs , Compliance with  medical plan , Home prepared for child , Pediatrician chosen  Psychotropic Medications:  Pediatrician: [employer]  Pediatrician List:    [employer]    [place]    [facility]   [facility]    Pediatrician Fax Number:  Risk Factors/Current Problems: Mental Health Concerns  Cognitive State: Alert  Mood/Affect: Relaxed  CSW Assessment:  CSW Plan/Description: CSW Will Continue to Monitor Umbilical Cord Tissue Drug Screen Results and Make Report if Nicoletta Dress, RN  12/20/2019, 4:33 PM

## 2019-12-20 NOTE — Lactation Note (Signed)
This note was copied from a baby's chart. Lactation Consultation Note  Patient Name: Betty Chavez RCBUL'A Date: 12/20/2019    MOB holding baby when Student entered room. MOB reported plan to obtain [name] Start formula from [facility]. Student provided proper formula mixing education.  MOB reported baby spitting up when feeding. Student provided education about paced-bottle feeding and positioning during feeds. Student provided education regarding pacifier use and its potential to hide feeding cues. Student provided education on feeding cues. MOB confirmed understanding of feeding cues education.   MOB reported no stools yet.   Maternal Data    Feeding Feeding Type: Bottle Fed - Formula Nipple Type: Slow - flow  LATCH Score                   Interventions    Lactation Tools Discussed/Used     Consult Status      [name] 12/20/2019, 10:24 AM

## 2019-12-20 NOTE — Progress Notes (Signed)
Post Partum Day # 1, s/p SVD  Subjective: no complaints, up ad lib, voiding and tolerating PO  Objective: Temp:  [97.6 F (36.4 C)-98.7 F (37.1 C)] 97.8 F (36.6 C) (01/12 1109) Pulse Rate:  [54-67] 62 (01/12 1109) Resp:  [16-20] 18 (01/12 1109) BP: (111-131)/(52-73) 111/71 (01/12 1109) SpO2:  [100 %] 100 % (01/12 0816)  Physical Exam:  General: alert and no distress  Lungs: clear to auscultation bilaterally Breasts: normal appearance, no masses or tenderness Heart: regular rate and rhythm, S1, S2 normal, no murmur, click, rub or gallop Abdomen: soft, non-tender; bowel sounds normal; no masses,  no organomegaly Pelvis: Lochia: appropriate, Uterine Fundus: firm Extremities: DVT Evaluation: No evidence of DVT seen on physical exam. Negative Homan's sign. No cords or calf tenderness. No significant calf/ankle edema.   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  Mild anemia of pregnancy. Can treat with PNV with iron.  Bottle feeding. Desires Nexplanon vs IUD for contraception management.  For varicella vaccination postpartum for non-immune status.  Plan for discharge tomorrow.  Reviewed records from [facility], no GBS noted as patient's last visit was at [redacted] weeks gestation.     LOS: 1 day   Hildred Laser, MD [facility] 12/20/2019 12:10 PM

## 2019-12-21 NOTE — Progress Notes (Signed)
CPS report given to on call case worker 12/20/2019 at [phone].  

## 2019-12-21 NOTE — Progress Notes (Signed)
DC to home with NB.  To car via staff.

## 2019-12-21 NOTE — Progress Notes (Signed)
DC inst reviewed with mom.  Verb u/o of care of self at home.  Reviewed danger signs of what to report to MD.

## 2019-12-21 NOTE — Discharge Summary (Signed)
OB Discharge Summary     Patient Name: Betty Chavez DOB: 1996-10-29 MRN: [id number]  Date of admission: 12/19/2019 Delivering MD: Hildred Laser   Date of discharge: 12/21/2019  Admitting diagnosis: Pregnancy [Z34.90] Labor, precipitous [O62.3] Intrauterine pregnancy: [redacted]w[redacted]d     Secondary diagnosis:  Active Problems:   Encounter for supervision of normal pregnancy in third trimester   Labor, precipitous   Insufficient prenatal care, second trimester  Additional problems: Marijuana use in pregnancy, Anemia of pregnancy     Discharge diagnosis: Term Pregnancy Delivered and Anemia                                                                                                Post partum procedures:None  Augmentation: None  Complications: None  Hospital course:  Onset of Labor With Vaginal Delivery     24 y.o. yo [id number] at [redacted]w[redacted]d was admitted in Active Labor on 12/19/2019. Patient had an uncomplicated labor course as follows:  Membrane Rupture Time/Date: 10:08 AM ,12/19/2019   Intrapartum Procedures: Episiotomy: None [1]                                         Lacerations:  None [1]  Patient had a delivery of a Viable infant. 12/19/2019   Delivery Summary for Betty Chavez  Labor Events:   Preterm labor: No data found  Rupture date: 12/19/2019  Rupture time: 10:08 AM  Rupture type: Spontaneous  Fluid Color: Moderate Meconium  Induction: No data found  Augmentation: No data found  Complications: No data found  Cervical ripening: No data found No data found   No data found     Delivery:   Episiotomy: No data found  Lacerations: No data found  Repair suture: No data found  Repair # of packets: No data found  Blood loss (ml): 200   Information for the patient's newborn:  Adessa, Primiano [[id number]]    Delivery 12/19/2019 10:12 AM by  Vaginal, Spontaneous Sex:  female Gestational Age: [redacted]w[redacted]d Delivery Clinician:   Living?:         APGARS  One minute Five  minutes Ten minutes  Skin color:        Heart rate:        Grimace:        Muscle tone:        Breathing:        Totals: 8  9      Presentation/position:      Resuscitation:   Cord information:    Disposition of cord blood:     Blood gases sent?  Complications:   Placenta: Delivered:       appearance Newborn Measurements: Weight: 9 lb 3.1 oz (4170 g)  Height: 20.87"  Head circumference:    Chest circumference:    Other providers:    Additional  information: Forceps:   Vacuum:   Breech:   Observed anomalies       [name] had an uncomplicated postpartum course.  She is ambulating, tolerating a regular diet, passing flatus, and urinating well. Patient is discharged home in stable condition on 12/21/19.   Physical exam  Vitals:   12/20/19 0816 12/20/19 1109 12/20/19 1921 12/20/19 2323  BP: 116/65 111/71 118/80 102/71  Pulse: 62 62 (!) 51 (!) 51  Resp: 20 18 18 20   Temp: 97.6 F (36.4 C) 97.8 F (36.6 C) 98 F (36.7 C) 97.8 F (36.6 C)  TempSrc: Axillary Axillary Oral Oral  SpO2: 100%  99% 100%  Weight:      Height:       General: alert and no distress Lochia: appropriate Uterine Fundus: firm Incision: N/A DVT Evaluation: No evidence of DVT seen on physical exam. Negative Homan's sign. No cords or calf tenderness. No significant calf/ankle edema.   Labs: Results for orders placed or performed during the hospital encounter of 12/19/19  SARS CORONAVIRUS 2 (TAT 6-24 HRS) Nasopharyngeal Nasopharyngeal Swab   Specimen: Nasopharyngeal Swab  Result Value Ref Range   SARS Coronavirus 2 NEGATIVE NEGATIVE  CBC  Result Value Ref Range   WBC 8.4 4.0 - 10.5 K/uL   RBC 4.27 3.87 - 5.11 MIL/uL   Hemoglobin 10.9 (L) 12.0 - 15.0 g/dL   HCT 34.0 (L) 36.0 - 46.0 %   MCV 79.6 (L) 80.0 - 100.0 fL   MCH 25.5 (L) 26.0 - 34.0 pg   MCHC 32.1 30.0 - 36.0 g/dL   RDW 14.3 11.5 - 15.5 %   Platelets 218 150 - 400 K/uL   nRBC 0.0 0.0 - 0.2 %  RPR  Result Value Ref Range   RPR  Ser Ql NON REACTIVE NON REACTIVE  OB RESULTS CONSOLE GC/Chlamydia  Result Value Ref Range   Gonorrhea Negative    Chlamydia Negative   OB RESULTS CONSOLE RPR  Result Value Ref Range   RPR Nonreactive   OB RESULTS CONSOLE HIV antibody  Result Value Ref Range   HIV Non-reactive   OB RESULTS CONSOLE Rubella Antibody  Result Value Ref Range   Rubella Immune   OB RESULTS CONSOLE Varicella zoster antibody, IgG  Result Value Ref Range   Varicella Immune   OB RESULTS CONSOLE Hepatitis B surface antigen  Result Value Ref Range   Hepatitis B Surface Ag Negative   Urine Drug Screen, Qualitative ([facility] only)  Result Value Ref Range   Tricyclic, Ur Screen NONE DETECTED NONE DETECTED   Amphetamines, Ur Screen NONE DETECTED NONE DETECTED   MDMA (Ecstasy)Ur Screen NONE DETECTED NONE DETECTED   Cocaine Metabolite,Ur [place] NONE DETECTED NONE DETECTED   Opiate, Ur Screen NONE DETECTED NONE DETECTED   Phencyclidine (PCP) Ur S NONE DETECTED NONE DETECTED   Cannabinoid 50 Ng, Ur [place] POSITIVE (A) NONE DETECTED   Barbiturates, Ur Screen NONE DETECTED NONE DETECTED   Benzodiazepine, Ur Scrn NONE DETECTED NONE DETECTED   Methadone Scn, Ur NONE DETECTED NONE DETECTED  OB RESULTS CONSOLE Varicella zoster antibody, IgG  Result Value Ref Range   Varicella Nonimmune   CBC  Result Value Ref Range   WBC 9.3 4.0 - 10.5 K/uL   RBC 4.19 3.87 - 5.11 MIL/uL   Hemoglobin 10.7 (L) 12.0 - 15.0 g/dL   HCT 33.0 (L) 36.0 - 46.0 %   MCV 78.8 (L) 80.0 - 100.0 fL   MCH 25.5 (L) 26.0 - 34.0 pg   MCHC 32.4 30.0 - 36.0 g/dL   RDW 14.3 11.5 - 15.5 %   Platelets 222 150 - 400 K/uL   nRBC  0.0 0.0 - 0.2 %  Type and screen [facility]  Result Value Ref Range   ABO/RH(D) O POS    Antibody Screen NEG    Sample Expiration      12/22/2019,2359 Performed at [facility], [address], [place], [place] [address]     Discharge instruction: per After Visit Summary and "Baby and Me  Booklet".  After visit meds:  Allergies as of 12/21/2019   No Known Allergies     Medication List    TAKE these medications   ibuprofen 800 MG tablet Commonly known as: ADVIL Take 1 tablet (800 mg total) by mouth every 8 (eight) hours as needed. What changed:   medication strength  how much to take  when to take this  reasons to take this   Prenatal Plus Iron 29-1 MG Tabs Take 1 tablet by mouth daily.       Diet: routine diet  Activity: Advance as tolerated. Pelvic rest for 6 weeks.   Outpatient follow up:4 weeks Follow up Appt:No future appointments. Follow up Visit:No follow-ups on file.  Postpartum contraception: Nexplanon vs IUD   Newborn Data: Live born female  Birth Weight: 9 lb 3.1 oz (4170 g) APGAR: 8, 9  Newborn Delivery   Birth date/time: 12/19/2019 10:12:00 Delivery type: Vaginal, Spontaneous      Baby Feeding: Bottle Disposition:home with mother   12/21/2019 Hildred Laser, MD  [facility]

## 2020-12-08 NOTE — L&D Delivery Note (Signed)
Delivery Summary for Betty Chavez  Labor Events:   Preterm labor: No data found  Rupture date: 08/25/2021  Rupture time: 2:30 PM  Rupture type: Spontaneous  Fluid Color: Clear  Induction: No data found  Augmentation: No data found  Complications: No data found  Cervical ripening: No data found No data found   No data found     Delivery:   Episiotomy: No data found  Lacerations: No data found  Repair suture: No data found  Repair # of packets: No data found  Blood loss (ml): No data found   Information for the patient's newborn:  Kamilya, Wakeman [[id number]]   Delivery 08/25/2021 9:24 PM by  Vaginal, Spontaneous Sex:  female Gestational Age: [redacted]w[redacted]d Delivery Clinician:   Living?:         APGARS  One minute Five minutes Ten minutes  Skin color:        Heart rate:        Grimace:        Muscle tone:        Breathing:        Totals: 8  9      Presentation/position:      Resuscitation:   Cord information:    Disposition of cord blood:     Blood gases sent?  Complications:   Placenta: Delivered:       appearance Newborn Measurements: Weight: 6 lb 9.5 oz (2990 g)  Height: 19"  Head circumference:    Chest circumference:    Other providers:    Additional  information: Forceps:   Vacuum:   Breech:   Observed anomalies      Delivery Note At 9:24 PM a viable and healthy female was delivered via Vaginal, Spontaneous (Presentation: Vertex; LOA position).  APGAR: 8, 9; weight  2990 grams.   Placenta status: Spontaneous, Intact.  Cord: 3 vessels with the following complications: None.  Cord pH: not obtained.  Delayed cord clamping observed.   Anesthesia: None Episiotomy:  None Lacerations:  None Suture Repair:  None Est. Blood Loss (mL):  150 ml  Mom to postpartum.  Baby to Couplet care / Skin to Skin.  Hildred Laser, MD 08/25/2021, 9:40 PM

## 2021-04-26 ENCOUNTER — Other Ambulatory Visit: Payer: Self-pay

## 2021-04-26 ENCOUNTER — Ambulatory Visit: Payer: Medicaid Other

## 2021-04-26 VITALS — BP 114/51 | HR 59 | Ht 67.0 in | Wt 168.5 lb

## 2021-04-26 DIAGNOSIS — Z3201 Encounter for pregnancy test, result positive: Secondary | ICD-10-CM

## 2021-04-26 LAB — PREGNANCY, URINE: Preg Test, Ur: POSITIVE — AB

## 2021-04-26 MED ORDER — PRENATAL VITAMIN 27-0.8 MG PO TABS: 1.0000 | ORAL_TABLET | Freq: Every day | ORAL | 0 refills | Status: AC

## 2021-07-06 ENCOUNTER — Other Ambulatory Visit: Payer: Self-pay

## 2021-07-06 ENCOUNTER — Emergency Department: Payer: Medicaid Other

## 2021-07-06 ENCOUNTER — Observation Stay
Admission: EM | Admit: 2021-07-06 | Discharge: 2021-07-06 | Disposition: A | Discharge status: Home / Self Care | Payer: Medicaid Other | Attending: Emergency Medicine | Admitting: Emergency Medicine

## 2021-07-06 DIAGNOSIS — O26853 Spotting complicating pregnancy, third trimester: Principal | ICD-10-CM | POA: Insufficient documentation

## 2021-07-06 DIAGNOSIS — R102 Pelvic and perineal pain: Secondary | ICD-10-CM | POA: Diagnosis not present

## 2021-07-06 DIAGNOSIS — O209 Hemorrhage in early pregnancy, unspecified: Secondary | ICD-10-CM | POA: Diagnosis not present

## 2021-07-06 DIAGNOSIS — N939 Abnormal uterine and vaginal bleeding, unspecified: Secondary | ICD-10-CM

## 2021-07-06 DIAGNOSIS — Z3A31 31 weeks gestation of pregnancy: Secondary | ICD-10-CM | POA: Diagnosis not present

## 2021-07-06 DIAGNOSIS — O4693 Antepartum hemorrhage, unspecified, third trimester: Secondary | ICD-10-CM

## 2021-07-06 LAB — CBC WITH DIFFERENTIAL/PLATELET
Abs Immature Granulocytes: 0.06 10*3/uL (ref 0.00–0.07)
Basophils Absolute: 0 10*3/uL (ref 0.0–0.1)
Basophils Relative: 1 %
Eosinophils Absolute: 0.1 10*3/uL (ref 0.0–0.5)
Eosinophils Relative: 1 %
HCT: 33.5 % — ABNORMAL LOW (ref 36.0–46.0)
Hemoglobin: 11 g/dL — ABNORMAL LOW (ref 12.0–15.0)
Immature Granulocytes: 1 %
Lymphocytes Relative: 20 %
Lymphs Abs: 1.7 10*3/uL (ref 0.7–4.0)
MCH: 27.8 pg (ref 26.0–34.0)
MCHC: 32.8 g/dL (ref 30.0–36.0)
MCV: 84.8 fL (ref 80.0–100.0)
Monocytes Absolute: 0.6 10*3/uL (ref 0.1–1.0)
Monocytes Relative: 7 %
Neutro Abs: 6 10*3/uL (ref 1.7–7.7)
Neutrophils Relative %: 70 %
Platelets: 200 10*3/uL (ref 150–400)
RBC: 3.95 MIL/uL (ref 3.87–5.11)
RDW: 13.2 % (ref 11.5–15.5)
WBC: 8.4 10*3/uL (ref 4.0–10.5)
nRBC: 0 % (ref 0.0–0.2)

## 2021-07-06 LAB — DIFFERENTIAL
Abs Immature Granulocytes: 0.06 K/uL (ref 0.00–0.07)
Basophils Absolute: 0 K/uL (ref 0.0–0.1)
Basophils Relative: 1 %
Eosinophils Absolute: 0.1 K/uL (ref 0.0–0.5)
Eosinophils Relative: 1 %
Immature Granulocytes: 1 %
Lymphocytes Relative: 22 %
Lymphs Abs: 1.9 K/uL (ref 0.7–4.0)
Monocytes Absolute: 0.6 K/uL (ref 0.1–1.0)
Monocytes Relative: 7 %
Neutro Abs: 6 K/uL (ref 1.7–7.7)
Neutrophils Relative %: 68 %

## 2021-07-06 LAB — URINALYSIS, COMPLETE (UACMP) WITH MICROSCOPIC
Bacteria, UA: NONE SEEN
Bilirubin Urine: NEGATIVE
Glucose, UA: NEGATIVE mg/dL
Hgb urine dipstick: NEGATIVE
Ketones, ur: NEGATIVE mg/dL
Nitrite: NEGATIVE
Protein, ur: NEGATIVE mg/dL
Specific Gravity, Urine: 1.02 (ref 1.005–1.030)
pH: 7 (ref 5.0–8.0)

## 2021-07-06 LAB — COMPREHENSIVE METABOLIC PANEL
ALT: 16 U/L (ref 0–44)
AST: 17 U/L (ref 15–41)
Albumin: 3 g/dL — ABNORMAL LOW (ref 3.5–5.0)
Alkaline Phosphatase: 73 U/L (ref 38–126)
Anion gap: 5 (ref 5–15)
BUN: 5 mg/dL — ABNORMAL LOW (ref 6–20)
CO2: 26 mmol/L (ref 22–32)
Calcium: 8.8 mg/dL — ABNORMAL LOW (ref 8.9–10.3)
Chloride: 104 mmol/L (ref 98–111)
Creatinine, Ser: 0.6 mg/dL (ref 0.44–1.00)
GFR, Estimated: >60 mL/min (ref >60)
Glucose, Bld: 92 mg/dL (ref 70–99)
Potassium: 4 mmol/L (ref 3.5–5.1)
Sodium: 135 mmol/L (ref 135–145)
Total Bilirubin: 0.6 mg/dL (ref 0.3–1.2)
Total Protein: 6.4 g/dL — ABNORMAL LOW (ref 6.5–8.1)

## 2021-07-06 LAB — CBC
HCT: 34.2 % — ABNORMAL LOW (ref 36.0–46.0)
Hemoglobin: 11.3 g/dL — ABNORMAL LOW (ref 12.0–15.0)
MCH: 27.8 pg (ref 26.0–34.0)
MCHC: 33 g/dL (ref 30.0–36.0)
MCV: 84.2 fL (ref 80.0–100.0)
Platelets: 197 K/uL (ref 150–400)
RBC: 4.06 MIL/uL (ref 3.87–5.11)
RDW: 13.3 % (ref 11.5–15.5)
WBC: 8.6 K/uL (ref 4.0–10.5)
nRBC: 0 % (ref 0.0–0.2)

## 2021-07-06 LAB — CHLAMYDIA/NGC RT PCR (ARMC ONLY)
Chlamydia Tr: NOT DETECTED
N gonorrhoeae: NOT DETECTED

## 2021-07-06 LAB — TYPE AND SCREEN
ABO/RH(D): O POS
Antibody Screen: NEGATIVE

## 2021-07-06 LAB — HCG, QUANTITATIVE, PREGNANCY: hCG, Beta Chain, Quant, S: 2841 m[IU]/mL — ABNORMAL HIGH

## 2021-07-06 LAB — RAPID HIV SCREEN (HIV 1/2 AB+AG)
HIV 1/2 Antibodies: NONREACTIVE
HIV-1 P24 Antigen - HIV24: NONREACTIVE

## 2021-07-06 LAB — ABO/RH: ABO/RH(D): O POS

## 2021-07-06 LAB — POC URINE PREG, ED: Preg Test, Ur: POSITIVE — AB

## 2021-07-06 MED ORDER — TERBUTALINE SULFATE 1 MG/ML IJ SOLN
0.2500 mg | Freq: Once | INTRAMUSCULAR | Status: AC
  Administered 2021-07-06: 0.25 mg via SUBCUTANEOUS

## 2021-07-06 MED ORDER — TERBUTALINE SULFATE 1 MG/ML IJ SOLN
INTRAMUSCULAR | Status: AC
  Filled 2021-07-06: qty 1

## 2021-07-06 NOTE — ED Triage Notes (Signed)
Pt has vaginal bloody spotting that started this morning and she is pregnant. Pt unsure how long she's been pregnant. Pt c/o pressure with urination. Pt denies, N/V/D, SOB, CP, cramping.

## 2021-07-06 NOTE — ED Provider Notes (Signed)
[facility] Emergency Department Provider Note  ____________________________________________   Event Date/Time   First MD Initiated Contact with Patient 07/06/21 1218     (approximate)  I have reviewed the triage vital signs and the nursing notes.   HISTORY  Chief Complaint Vaginal Bleeding  HPI Betty Chavez is a 25 y.o. female who presents to the emergency department with known pregnancy but unsure how far along she is.  She states that yesterday, she began having vaginal bleeding that tapered some today.  She also reports pelvic pressure with urinating.  She is G6, P4.  She states that last period she believes was in early June, but states that her periods are very irregular and do not necessarily occur monthly.  She has not had any vaginal leaking, cramping or contractions.          Past Medical History:  Diagnosis Date   History of urinary tract infection    Medical history non-contributory    Spontaneous abortion 2014    Patient Active Problem List   Diagnosis Date Noted   Labor, precipitous 12/19/2019   Uterine contractions during pregnancy 05/02/2017   Marijuana use 12/10/2016   Maternal varicella, non-immune 12/09/2016   Encounter for supervision of normal pregnancy in third trimester 12/02/2016   Insufficient prenatal care, second trimester 12/02/2016    Past Surgical History:  Procedure Laterality Date   NO PAST SURGERIES      Prior to Admission medications   Medication Sig Start Date End Date Taking? Authorizing Provider  Prenatal Vit-Fe Fumarate-FA (PRENATAL VITAMIN) 27-0.8 MG TABS Take 1 tablet by mouth daily at 6 (six) AM. 04/26/21  Yes Federico Flake, MD  ibuprofen (ADVIL) 800 MG tablet Take 1 tablet (800 mg total) by mouth every 8 (eight) hours as needed. Patient not taking: No sig reported 12/20/19   Hildred Laser, MD    Allergies Patient has no known allergies.  Family History  Problem Relation Age of Onset    Cirrhosis Maternal Grandfather    Healthy Father    Healthy Mother    Healthy Brother    Healthy Sister    Healthy Brother    Healthy Brother    Healthy Sister    Healthy Daughter    Healthy Daughter    Healthy Daughter    Healthy Son     Social History Social History   Tobacco Use   Smoking status: Never   Smokeless tobacco: Never  Vaping Use   Vaping Use: Never used  Substance Use Topics   Alcohol use: No   Drug use: Yes    Types: Marijuana    Comment: Last marijuana use 04/2021.    Review of Systems Constitutional: No fever/chills Eyes: No visual changes. ENT: No sore throat. Cardiovascular: Denies chest pain. Respiratory: Denies shortness of breath. Gastrointestinal: No abdominal pain.  No nausea, no vomiting.  No diarrhea.  No constipation. Genitourinary: + Vaginal bleeding in pregnancy Musculoskeletal: Negative for back pain. Skin: Negative for rash. Neurological: Negative for headaches, focal weakness or numbness.  ____________________________________________   PHYSICAL EXAM:  VITAL SIGNS: ED Triage Vitals  Enc Vitals Group     BP 07/06/21 1131 126/75     Pulse Rate 07/06/21 1131 73     Resp 07/06/21 1131 19     Temp 07/06/21 1131 98.3 F (36.8 C)     Temp Source 07/06/21 1131 Oral     SpO2 07/06/21 1131 100 %     Weight 07/06/21 1132  175 lb (79.4 kg)     Height 07/06/21 1132 5\' 7"  (1.702 m)     Head Circumference --      Peak Flow --      Pain Score 07/06/21 1142 1     Pain Loc --      Pain Edu? --      Excl. in GC? --    Constitutional: Alert and oriented. Well appearing and in no acute distress. Eyes: Conjunctivae are normal. PERRL. EOMI. Head: Atraumatic. Nose: No congestion/rhinnorhea. Mouth/Throat: Mucous membranes are moist.  Oropharynx non-erythematous. Neck: No stridor.   Cardiovascular: Normal rate, regular rhythm. Grossly normal heart sounds.  Good peripheral circulation. Respiratory: Normal respiratory effort.  No  retractions. Lungs CTAB. Gastrointestinal: Nontender to palpation.  Uterine fundus well above the umbilicus.  No abdominal bruits. No CVA tenderness. Neurologic:  Normal speech and language. No gross focal neurologic deficits are appreciated. No gait instability. Skin:  Skin is warm, dry and intact. No rash noted. Psychiatric: Mood and affect are normal. Speech and behavior are normal.  ____________________________________________   LABS (all labs ordered are listed, but only abnormal results are displayed)  Labs Reviewed  URINALYSIS, COMPLETE (UACMP) WITH MICROSCOPIC - Abnormal; Notable for the following components:      Result Value   Color, Urine YELLOW (*)    APPearance CLOUDY (*)    Leukocytes,Ua SMALL (*)    All other components within normal limits  CBC WITH DIFFERENTIAL/PLATELET - Abnormal; Notable for the following components:   Hemoglobin 11.0 (*)    HCT 33.5 (*)    All other components within normal limits  COMPREHENSIVE METABOLIC PANEL - Abnormal; Notable for the following components:   BUN 5 (*)    Calcium 8.8 (*)    Total Protein 6.4 (*)    Albumin 3.0 (*)    All other components within normal limits  HCG, QUANTITATIVE, PREGNANCY - Abnormal; Notable for the following components:   hCG, Beta Chain, Quant, S 2,841 (*)    All other components within normal limits  POC URINE PREG, ED - Abnormal; Notable for the following components:   Preg Test, Ur POSITIVE (*)    All other components within normal limits  URINE CULTURE  ABO/RH    ____________________________________________  RADIOLOGY  Official radiology report(s): 12-22-1984 OB Limited  Result Date: 07/06/2021 CLINICAL DATA:  Vaginal bleeding for a day. EXAM: LIMITED OBSTETRIC ULTRASOUND COMPARISON:  None. FINDINGS: Number of Fetuses: 1 Heart Rate:  136 bpm Movement: Yes Presentation: Cephalic Placental Location: Fundal Previa: No Amniotic Fluid (Subjective):  Within normal limits. AFI: 12.7 cm BPD: 7.76 cm 31 w  1 d  MATERNAL FINDINGS: Cervix:  The cervix is closed measuring 4.6 cm in length Uterus/Adnexae: No abnormality visualized. IMPRESSION: Single live IUP.  No cause for bleeding identified on this study. This exam is performed on an emergent basis and does not comprehensively evaluate fetal size, dating, or anatomy; follow-up complete OB 07/08/2021 should be considered if further fetal assessment is warranted. Electronically Signed   By: [place] III M.D   On: 07/06/2021 13:56    ____________________________________________   INITIAL IMPRESSION / ASSESSMENT AND PLAN / ED COURSE  As part of my medical decision making, I reviewed the following data within the electronic MEDICAL RECORD NUMBER Nursing notes reviewed and incorporated, Labs reviewed, and Notes from prior ED visits        Patient is a G6, P4 female who states that she knows that she is pregnant, but  unsure how far along she could be.  She denies any prenatal care during this pregnancy.  She believes her last menstrual cycle was in early June, around 7 weeks ago.  She comes in for vaginal bleeding that started yesterday.  See HPI for further details.  In triage, patient has grossly normal vital signs.  Physical exam with a uterine fundus palpated above the umbilicus, inconsistent with patient's last presumed dating.  No tenderness to palpation of the abdomen.  GU exam deferred.  Laboratory evaluation was obtained with CBC, CMP, quantitative hCG, ABO Rh, urinalysis.  CBC with a mildly low hemoglobin of 11.0, otherwise unremarkable.  CMP with slightly low protein and albumin of 3.0, otherwise grossly unremarkable.  Quantitative hCG is roughly 2800.  ABO Rh status is O+.  Urinalysis with small number of leukocytes, no bacteria.  We will send this for culture.  OB ultrasound was obtained given known pregnancy status with vaginal bleeding.  Ultrasound displays a dating of roughly 31 weeks.  When I discussed this with the patient, she has a lot of questions  and is somewhat in denial.  I discussed with her the challenge of accurately dating a pregnancy with ultrasound in the third trimester, and recommended that OB would need to discuss the dating with her further.  Given that she is likely in the third trimester and having vaginal bleeding, I did discuss with her the importance of evaluation by OB/GYN upstairs, and she is amenable with receiving evaluation with them.  The patient is stable at this time for transfer to L&D for further evaluation.      ____________________________________________   FINAL CLINICAL IMPRESSION(S) / ED DIAGNOSES  Final diagnoses:  Vaginal bleeding in pregnancy, third trimester     ED Discharge Orders     None        Note:  This document was prepared using Dragon voice recognition software and may include unintentional dictation errors.    Lucy Chris, PA 07/06/21 Shane Crutch    Jene Every, MD 07/07/21 [id number]

## 2021-07-06 NOTE — OB Triage Note (Addendum)
Patient Discharged home per Logan Bores MD. Pt educated about labor precautions and informed when to return to the ED for further evaluation. Pt instructed to call [facility] Monday for follow up appointments with her provider. AVS given to patient and RN answered all questions and patient has no further questions at this time. Pt discharged home in stable condition.

## 2021-07-06 NOTE — OB Triage Note (Signed)
Pt is a [id number] that was seen in the ED for vaginal Bleeding. Pt is dated at [redacted]w[redacted]d from the [facility] but an US done today in the ED showed the pt dating at [redacted]w[redacted]d. Pt states that she had some pink on the toilet paper after she wiped this morning. No bright red bleeding noted on assessment and the patient states she has not had to wear a pad. Pt states postive FM, denies LOF. EFM applied.

## 2021-07-07 LAB — HIV ANTIBODY (ROUTINE TESTING W REFLEX): HIV Screen 4th Generation wRfx: NONREACTIVE

## 2021-07-07 LAB — RPR: RPR Ser Ql: NONREACTIVE

## 2021-07-07 LAB — HEPATITIS B SURFACE ANTIGEN: Hepatitis B Surface Ag: NONREACTIVE

## 2021-07-07 NOTE — Discharge Summary (Signed)
    L&D OB Triage Note  SUBJECTIVE Betty Chavez is a 25 y.o. [id number] female at [redacted]w[redacted]d, EDD Estimated Date of Delivery: 09/06/21 who presented to triage with complaints of onset of vaginal bleeding.  Patient states that she does not have to wear a pad and the bleeding is not bright red.  She originally thought she was [redacted] weeks gestation but after being seen in the ED was found to be 31 weeks estimated gestational age.  No prenatal care.  Denies leakage of fluid.  Reports fetal movement.  OB History  Gravida Para Term Preterm AB Living  6 4 3  0 1 4  SAB IAB Ectopic Multiple Live Births  1 0 0 0 3    # Outcome Date GA Lbr Len/2nd Weight Sex Delivery Anes PTL Lv  6 Current           5 Para 12/19/19  / 00:10 4170 g F Vag-Spont None  LIV     Name: Funari,GIRL Lisel     Apgar1: 8  Apgar5: 9  4 Term 04/18/18 [redacted]w[redacted]d  3890 g F Vag-Spont Other  LIV     Name: Laine,GIRL Andretta     Apgar1: 8  Apgar5: 9  3 Term 05/02/17 [redacted]w[redacted]d  3402 g F Vag-Spont     2 Term 08/03/14 [redacted]w[redacted]d  3827 g M Vag-Spont   LIV  1 SAB 2014 [redacted]w[redacted]d           No medications prior to admission.     OBJECTIVE  Nursing Evaluation:   BP (!) 104/59 (BP Location: Left Arm)   Pulse 76   Temp 98.1 F (36.7 C) (Oral)   Resp 14   Ht 5\' 7"  (1.702 m)   Wt 76.2 kg   LMP 05/15/2021 (Within Days)   SpO2 100%   BMI 26.31 kg/m    Findings:      Occasional contractions noted but resolved with hydration and a single dose of terbutaline.   Patient not in labor   Minimal vaginal bleeding noted       NST was performed and has been reviewed by me.  NST INTERPRETATION: Category I  Mode: External Baseline Rate (A): 145 bpm (fht) Variability: Moderate Accelerations: 15 x 15 Decelerations: None     Contraction Frequency (min): 3-10  ASSESSMENT Impression:  1.  Pregnancy:  at [redacted]w[redacted]d , EDD Estimated Date of Delivery: 09/06/21 2.  Reassuring fetal and maternal status   PLAN 1. Current condition and above findings  reviewed.  Reassuring fetal and maternal condition. 2. Discharge home with standard labor precautions given to return to L&D or call the office for problems. 3. Prenatal labs performed 4. Patient urged to seek prenatal care as soon as possible.

## 2021-07-08 ENCOUNTER — Telehealth: Payer: Self-pay

## 2021-07-08 NOTE — Telephone Encounter (Addendum)
Transition Care Management Unsuccessful Follow-up Telephone Call  Date of discharge and from where:  07/06/2021-[facility]  Attempts:  1st Attempt  Reason for unsuccessful TCM follow-up call:  Unable to reach patient

## 2021-07-09 LAB — URINE CULTURE

## 2021-07-09 LAB — RUBELLA SCREEN: Rubella: 7.86 index (ref >0.99)

## 2021-07-09 NOTE — Telephone Encounter (Signed)
Transition Care Management Unsuccessful Follow-up Telephone Call  Date of discharge and from where:  07/06/2021-[facility]  Attempts:  2nd Attempt  Reason for unsuccessful TCM follow-up call:  Unable to reach patient

## 2021-07-10 NOTE — Telephone Encounter (Signed)
Transition Care Management Unsuccessful Follow-up Telephone Call  Date of discharge and from where:  07/06/2021-[facility]  Attempts:  3rd Attempt  Reason for unsuccessful TCM follow-up call:  Unable to reach patient

## 2021-07-14 NOTE — Progress Notes (Signed)
OB abstraction completed per phone interview.  Pt aware of appointment tomorrow 07-15-21.  Hal Hope RN

## 2021-08-25 ENCOUNTER — Other Ambulatory Visit: Payer: Self-pay

## 2021-08-25 ENCOUNTER — Encounter: Payer: Self-pay | Admitting: Obstetrics and Gynecology

## 2021-08-25 ENCOUNTER — Inpatient Hospital Stay
Admission: EM | Admit: 2021-08-25 | Discharge: 2021-08-27 | DRG: 806 | Disposition: A | Discharge status: Home / Self Care | Payer: Medicaid Other | Attending: Obstetrics and Gynecology | Admitting: Obstetrics and Gynecology

## 2021-08-25 DIAGNOSIS — Z20822 Contact with and (suspected) exposure to covid-19: Secondary | ICD-10-CM | POA: Diagnosis present

## 2021-08-25 DIAGNOSIS — O4202 Full-term premature rupture of membranes, onset of labor within 24 hours of rupture: Secondary | ICD-10-CM

## 2021-08-25 DIAGNOSIS — Z3A37 37 weeks gestation of pregnancy: Secondary | ICD-10-CM

## 2021-08-25 DIAGNOSIS — Z23 Encounter for immunization: Secondary | ICD-10-CM

## 2021-08-25 DIAGNOSIS — O09293 Supervision of pregnancy with other poor reproductive or obstetric history, third trimester: Secondary | ICD-10-CM | POA: Diagnosis not present

## 2021-08-25 DIAGNOSIS — O4290 Premature rupture of membranes, unspecified as to length of time between rupture and onset of labor, unspecified weeks of gestation: Secondary | ICD-10-CM | POA: Diagnosis present

## 2021-08-25 DIAGNOSIS — O99324 Drug use complicating childbirth: Secondary | ICD-10-CM

## 2021-08-25 DIAGNOSIS — O4292 Full-term premature rupture of membranes, unspecified as to length of time between rupture and onset of labor: Secondary | ICD-10-CM | POA: Diagnosis present

## 2021-08-25 DIAGNOSIS — O0930 Supervision of pregnancy with insufficient antenatal care, unspecified trimester: Secondary | ICD-10-CM

## 2021-08-25 DIAGNOSIS — F1290 Cannabis use, unspecified, uncomplicated: Secondary | ICD-10-CM | POA: Diagnosis present

## 2021-08-25 LAB — CBC
HCT: 31.6 % — ABNORMAL LOW (ref 36.0–46.0)
Hemoglobin: 10.8 g/dL — ABNORMAL LOW (ref 12.0–15.0)
MCH: 28.3 pg (ref 26.0–34.0)
MCHC: 34.2 g/dL (ref 30.0–36.0)
MCV: 82.7 fL (ref 80.0–100.0)
Platelets: 212 10*3/uL (ref 150–400)
RBC: 3.82 MIL/uL — ABNORMAL LOW (ref 3.87–5.11)
RDW: 13.2 % (ref 11.5–15.5)
WBC: 11 10*3/uL — ABNORMAL HIGH (ref 4.0–10.5)
nRBC: 0 % (ref 0.0–0.2)

## 2021-08-25 LAB — CHLAMYDIA/NGC RT PCR (ARMC ONLY)
Chlamydia Tr: NOT DETECTED
N gonorrhoeae: NOT DETECTED

## 2021-08-25 LAB — URINE DRUG SCREEN, QUALITATIVE (ARMC ONLY)
Amphetamines, Ur Screen: NOT DETECTED
Barbiturates, Ur Screen: NOT DETECTED
Benzodiazepine, Ur Scrn: NOT DETECTED
Cannabinoid 50 Ng, Ur ~~LOC~~: POSITIVE — AB
Cocaine Metabolite,Ur ~~LOC~~: NOT DETECTED
MDMA (Ecstasy)Ur Screen: NOT DETECTED
Methadone Scn, Ur: NOT DETECTED
Opiate, Ur Screen: NOT DETECTED
Phencyclidine (PCP) Ur S: NOT DETECTED
Tricyclic, Ur Screen: NOT DETECTED

## 2021-08-25 LAB — GROUP B STREP BY PCR: Group B strep by PCR: NEGATIVE

## 2021-08-25 LAB — RESP PANEL BY RT-PCR (FLU A&B, COVID) ARPGX2
Influenza A by PCR: NEGATIVE
Influenza B by PCR: NEGATIVE
SARS Coronavirus 2 by RT PCR: NEGATIVE

## 2021-08-25 LAB — RUPTURE OF MEMBRANE (ROM)PLUS: Rom Plus: POSITIVE

## 2021-08-25 LAB — TYPE AND SCREEN
ABO/RH(D): O POS
Antibody Screen: NEGATIVE

## 2021-08-25 MED ORDER — AMMONIA AROMATIC IN INHA
RESPIRATORY_TRACT | Status: AC
  Filled 2021-08-25: qty 10

## 2021-08-25 MED ORDER — MISOPROSTOL 200 MCG PO TABS
ORAL_TABLET | ORAL | Status: AC
  Filled 2021-08-25: qty 4

## 2021-08-25 MED ORDER — OXYTOCIN 10 UNIT/ML IJ SOLN
INTRAMUSCULAR | Status: AC
  Filled 2021-08-25: qty 2

## 2021-08-25 MED ORDER — SOD CITRATE-CITRIC ACID 500-334 MG/5ML PO SOLN: 30.0000 mL | ORAL | Status: DC | PRN

## 2021-08-25 MED ORDER — OXYCODONE-ACETAMINOPHEN 5-325 MG PO TABS: 1.0000 | ORAL_TABLET | ORAL | Status: DC | PRN

## 2021-08-25 MED ORDER — ONDANSETRON HCL 4 MG/2ML IJ SOLN: 4.0000 mg | Freq: Four times a day (QID) | INTRAMUSCULAR | Status: DC | PRN

## 2021-08-25 MED ORDER — TERBUTALINE SULFATE 1 MG/ML IJ SOLN: 0.2500 mg | Freq: Once | INTRAMUSCULAR | Status: DC | PRN

## 2021-08-25 MED ORDER — OXYTOCIN-SODIUM CHLORIDE 30-0.9 UT/500ML-% IV SOLN
1.0000 m[IU]/min | INTRAVENOUS | Status: DC
  Administered 2021-08-25: 8 m[IU]/min via INTRAVENOUS

## 2021-08-25 MED ORDER — OXYTOCIN BOLUS FROM INFUSION: 333.0000 mL | Freq: Once | INTRAVENOUS | Status: DC

## 2021-08-25 MED ORDER — LACTATED RINGERS IV BOLUS: 1000.0000 mL | Freq: Once | INTRAVENOUS | Status: DC

## 2021-08-25 MED ORDER — OXYCODONE-ACETAMINOPHEN 5-325 MG PO TABS: 2.0000 | ORAL_TABLET | ORAL | Status: DC | PRN

## 2021-08-25 MED ORDER — ACETAMINOPHEN 325 MG PO TABS: 650.0000 mg | ORAL_TABLET | ORAL | Status: DC | PRN

## 2021-08-25 MED ORDER — OXYTOCIN-SODIUM CHLORIDE 30-0.9 UT/500ML-% IV SOLN
2.5000 [IU]/h | INTRAVENOUS | Status: DC
  Filled 2021-08-25: qty 500

## 2021-08-25 MED ORDER — LACTATED RINGERS IV SOLN: 125.0000 mL/h | INTRAVENOUS | Status: DC

## 2021-08-25 MED ORDER — BUTORPHANOL TARTRATE 1 MG/ML IJ SOLN: 1.0000 mg | INTRAMUSCULAR | Status: DC | PRN

## 2021-08-25 MED ORDER — LIDOCAINE HCL (PF) 1 % IJ SOLN
30.0000 mL | INTRAMUSCULAR | Status: DC | PRN
  Filled 2021-08-25: qty 30

## 2021-08-25 MED ORDER — LACTATED RINGERS IV SOLN
500.0000 mL | INTRAVENOUS | Status: DC | PRN
Start: 2021-08-25 — End: 2021-08-26

## 2021-08-25 NOTE — H&P (Signed)
Obstetric History and Physical  Betty Chavez is a 25 y.o. [id number] with IUP at [redacted]w[redacted]d presenting for complaints of SROM at approximately 2:30 pm today.  Patient states she has been having  infrequent contractions,  no  vaginal bleeding, ruptured clear fluid membranes, with active fetal movement.    Of note, patient has not received any PNC this pregnancy. Had 1 confirmation visit at ~ [redacted] weeks gestation at a local clinic and 1 triage visit at [redacted] weeks gestation due to bleeding in third trimester.   Prenatal Course Source of Care: None. Has had 2 triage visits.  Pregnancy complications or risks: Patient Active Problem List   Diagnosis Date Noted   Indication for care in labor or delivery 08/25/2021   PROM (premature rupture of membranes) 08/25/2021   Indication for care in labor and delivery, antepartum 07/06/2021   Labor, precipitous 12/19/2019   Uterine contractions during pregnancy 05/02/2017   Marijuana use 12/10/2016   Encounter for supervision of normal pregnancy in third trimester 12/02/2016   Insufficient prenatal care, second trimester 12/02/2016   She plans to try to breastfeed She desires  Xulane patch  for postpartum contraception.   Prenatal labs and studies: ABO, Rh: --/--/O POS (09/18 1725) Antibody: NEG (09/18 1725) Rubella: 7.86 (07/30 1610) RPR: NON REACTIVE (07/30 1610)  HBsAg: NON REACTIVE (07/30 1610)  HIV: NON REACTIVE, Non Reactive (07/30 1610)  AJO:INOMVEHM/-- (09/18 1623) 1 hr Glucola  not performed Genetic screening  not performed Anatomy US  not performed   Past Medical History:  Diagnosis Date   Anemia    Depression    pt states PP depression after all deliveries but never reported   Headache    states headaches with 2018 pregnancy   History of urinary tract infection    Infection 2018   severe wound infection from spider bite   Medical history non-contributory    Spontaneous abortion 2014    Past Surgical History:  Procedure Laterality  Date   NO PAST SURGERIES      OB History  Gravida Para Term Preterm AB Living  6 4 3  0 1 4  SAB IAB Ectopic Multiple Live Births  1 0 0 0 4    # Outcome Date GA Lbr Len/2nd Weight Sex Delivery Anes PTL Lv  6 Current           5 Para 12/19/19  / 00:10 4170 g F Vag-Spont None  LIV  4 Term 04/18/18 [redacted]w[redacted]d  3890 g F Vag-Spont Other  LIV  3 Term 05/02/17 [redacted]w[redacted]d  3402 g F Vag-Spont     2 Term 08/03/14 [redacted]w[redacted]d  3827 g M Vag-Spont   LIV  1 SAB 2014 [redacted]w[redacted]d           Obstetric Comments  States had ultrasound at hospital 07-06-21 due to vag bleeding and told that she was [redacted] weeks pregnant and says no prenatal care.    Social History   Socioeconomic History   Marital status: Single    Spouse name: Not on file   Number of children: 4   Years of education: 12   Highest education level: High school graduate  Occupational History   Not on file  Tobacco Use   Smoking status: Never    Passive exposure: Never   Smokeless tobacco: Never  Vaping Use   Vaping Use: Never used  Substance and Sexual Activity   Alcohol use: Not Currently    Comment: states last alcohol (liquor) Jan 2022  Drug use: Yes    Types: Marijuana    Comment: Last marijuana use 04/2021.   Sexual activity: Not Currently    Birth control/protection: Patch  Other Topics Concern   Not on file  Social History Narrative   Lives alone with 4 children   Social Determinants of Health   Financial Resource Strain: Not on file  Food Insecurity: Not on file  Transportation Needs: Not on file  Physical Activity: Not on file  Stress: Not on file  Social Connections: Not on file    Family History  Problem Relation Age of Onset   Healthy Mother    Healthy Father    Healthy Sister    Healthy Sister    Healthy Brother    Healthy Brother    Healthy Brother    Healthy Daughter    Healthy Daughter    Healthy Daughter    Healthy Son    Healthy Maternal Grandmother    Alcohol abuse Maternal Grandfather    Cirrhosis  Maternal Grandfather     Medications Prior to Admission  Medication Sig Dispense Refill Last Dose   Prenatal Vit-Fe Fumarate-FA (PRENATAL VITAMIN) 27-0.8 MG TABS Take 1 tablet by mouth daily at 6 (six) AM. 100 tablet 0 08/24/2021   ibuprofen (ADVIL) 800 MG tablet Take 1 tablet (800 mg total) by mouth every 8 (eight) hours as needed. (Patient not taking: No sig reported) 30 tablet 1 Not Taking    No Known Allergies  Review of Systems: Negative except for what is mentioned in HPI.  Physical Exam: BP 124/69 (BP Location: Left Arm)   Pulse 75   Temp 97.7 F (36.5 C) (Oral)   Resp 16   Ht 5\' 7"  (1.702 m)   Wt 74.8 kg   LMP 12/04/2020 (Approximate)   BMI 25.84 kg/m  CONSTITUTIONAL: Well-developed, well-nourished female in no acute distress.  HENT:  Normocephalic, atraumatic, External right and left ear normal. Oropharynx is clear and moist EYES: Conjunctivae and EOM are normal. Pupils are equal, round, and reactive to light. No scleral icterus.  NECK: Normal range of motion, supple, no masses SKIN: Skin is warm and dry. No rash noted. Not diaphoretic. No erythema. No pallor. NEUROLOGIC: Alert and oriented to person, place, and time. Normal reflexes, muscle tone coordination. No cranial nerve deficit noted. PSYCHIATRIC: Normal mood and affect. Normal behavior. Normal judgment and thought content. CARDIOVASCULAR: Normal heart rate noted, regular rhythm RESPIRATORY: Effort and breath sounds normal, no problems with respiration noted ABDOMEN: Soft, nontender, nondistended, gravid. MUSCULOSKELETAL: Normal range of motion. No edema and no tenderness. 2+ distal pulses.  Cervical Exam: Dilatation 6 cm   Effacement 70%   Station -2   (changed from 4/70/-2 in triage) Presentation: cephalic FHT:  Baseline rate 135 bpm   Variability moderate  Accelerations present   Decelerations none Contractions: Every 3-7 mins   Pertinent Labs/Studies:   Results for orders placed or performed during the  hospital encounter of 08/25/21 (from the past 24 hour(s))  ROM Plus ([facility] only)     Status: None   Collection Time: 08/25/21  3:12 PM  Result Value Ref Range   Rom Plus POSITIVE   Resp Panel by RT-PCR (Flu A&B, Covid) Nasopharyngeal Swab     Status: None   Collection Time: 08/25/21  3:59 PM   Specimen: Nasopharyngeal Swab; Nasopharyngeal(NP) swabs in vial transport medium  Result Value Ref Range   SARS Coronavirus 2 by RT PCR NEGATIVE NEGATIVE   Influenza A by PCR NEGATIVE  NEGATIVE   Influenza B by PCR NEGATIVE NEGATIVE  Urine Drug Screen, Qualitative ([facility] only)     Status: Abnormal   Collection Time: 08/25/21  4:23 PM  Result Value Ref Range   Tricyclic, Ur Screen NONE DETECTED NONE DETECTED   Amphetamines, Ur Screen NONE DETECTED NONE DETECTED   MDMA (Ecstasy)Ur Screen NONE DETECTED NONE DETECTED   Cocaine Metabolite,Ur [place] NONE DETECTED NONE DETECTED   Opiate, Ur Screen NONE DETECTED NONE DETECTED   Phencyclidine (PCP) Ur S NONE DETECTED NONE DETECTED   Cannabinoid 50 Ng, Ur [place] POSITIVE (A) NONE DETECTED   Barbiturates, Ur Screen NONE DETECTED NONE DETECTED   Benzodiazepine, Ur Scrn NONE DETECTED NONE DETECTED   Methadone Scn, Ur NONE DETECTED NONE DETECTED  Group B strep by PCR     Status: None   Collection Time: 08/25/21  4:23 PM   Specimen: Urine, Random; Genital  Result Value Ref Range   Group B strep by PCR NEGATIVE NEGATIVE  CBC     Status: Abnormal   Collection Time: 08/25/21  5:25 PM  Result Value Ref Range   WBC 11.0 (H) 4.0 - 10.5 K/uL   RBC 3.82 (L) 3.87 - 5.11 MIL/uL   Hemoglobin 10.8 (L) 12.0 - 15.0 g/dL   HCT 95.0 (L) 93.2 - 67.1 %   MCV 82.7 80.0 - 100.0 fL   MCH 28.3 26.0 - 34.0 pg   MCHC 34.2 30.0 - 36.0 g/dL   RDW 24.5 80.9 - 98.3 %   Platelets 212 150 - 400 K/uL   nRBC 0.0 0.0 - 0.2 %  Type and screen [facility]     Status: None   Collection Time: 08/25/21  5:25 PM  Result Value Ref Range   ABO/RH(D) O POS    Antibody Screen  NEG    Sample Expiration      08/28/2021,2359 Performed at [facility], [address] [place], [place] [address]     Imaging:  US OB Limited CLINICAL DATA:  Vaginal bleeding for a day.  EXAM: LIMITED OBSTETRIC ULTRASOUND  COMPARISON:  None.  FINDINGS: Number of Fetuses: 1  Heart Rate:  136 bpm  Movement: Yes  Presentation: Cephalic  Placental Location: Fundal  Previa: No  Amniotic Fluid (Subjective):  Within normal limits.  AFI: 12.7 cm  BPD: 7.76 cm 31 w  1 d  MATERNAL FINDINGS:  Cervix:  The cervix is closed measuring 4.6 cm in length  Uterus/Adnexae: No abnormality visualized.  IMPRESSION: Single live IUP.  No cause for bleeding identified on this study.  This exam is performed on an emergent basis and does not comprehensively evaluate fetal size, dating, or anatomy; follow-up complete OB US should be considered if further fetal assessment is warranted.  Electronically Signed   By: Gerome Sam III M.D   On: 07/06/2021 13:56   Assessment : Betty Chavez is a 25 y.o. [id number] at [redacted]w[redacted]d being admitted for induction of labor due to PROM. No prenatal care.  H/o fetal macrosomia with last birth. Marijuana use in pregnancy.   Plan: - Labor: Induction/Augmentation as ordered with Pitocin as per protocol. Analgesia as needed. Will order HgbA1c to screen for diabetes as patient had h/o macrosomia with last pregnancy.  - FWB: Reassuring fetal heart tracing.  GBS negative - Delivery plan: Hopeful for vaginal delivery. Social work consult postpartum for no PNC and marijuana use.      Hildred Laser, MD [facility]

## 2021-08-26 ENCOUNTER — Encounter: Payer: Self-pay | Admitting: Obstetrics and Gynecology

## 2021-08-26 LAB — CBC
HCT: 31.7 % — ABNORMAL LOW (ref 36.0–46.0)
Hemoglobin: 10.8 g/dL — ABNORMAL LOW (ref 12.0–15.0)
MCH: 28.2 pg (ref 26.0–34.0)
MCHC: 34.1 g/dL (ref 30.0–36.0)
MCV: 82.8 fL (ref 80.0–100.0)
Platelets: 212 10*3/uL (ref 150–400)
RBC: 3.83 MIL/uL — ABNORMAL LOW (ref 3.87–5.11)
RDW: 13.2 % (ref 11.5–15.5)
WBC: 11 10*3/uL — ABNORMAL HIGH (ref 4.0–10.5)
nRBC: 0 % (ref 0.0–0.2)

## 2021-08-26 LAB — RPR: RPR Ser Ql: NONREACTIVE

## 2021-08-26 LAB — HEMOGLOBIN A1C
Hgb A1c MFr Bld: 5 % (ref 4.8–5.6)
Mean Plasma Glucose: 96.8 mg/dL

## 2021-08-26 MED ORDER — COCONUT OIL OIL: 1.0000 "application " | TOPICAL_OIL | Status: DC | PRN

## 2021-08-26 MED ORDER — ZOLPIDEM TARTRATE 5 MG PO TABS: 5.0000 mg | ORAL_TABLET | Freq: Every evening | ORAL | Status: DC | PRN

## 2021-08-26 MED ORDER — TETANUS-DIPHTH-ACELL PERTUSSIS 5-2.5-18.5 LF-MCG/0.5 IM SUSY
0.5000 mL | PREFILLED_SYRINGE | Freq: Once | INTRAMUSCULAR | Status: AC
  Administered 2021-08-27: 0.5 mL via INTRAMUSCULAR
  Filled 2021-08-26: qty 0.5

## 2021-08-26 MED ORDER — DIBUCAINE (PERIANAL) 1 % EX OINT: 1.0000 "application " | TOPICAL_OINTMENT | CUTANEOUS | Status: DC | PRN

## 2021-08-26 MED ORDER — SIMETHICONE 80 MG PO CHEW: 80.0000 mg | CHEWABLE_TABLET | ORAL | Status: DC | PRN

## 2021-08-26 MED ORDER — PRENATAL MULTIVITAMIN CH
1.0000 | ORAL_TABLET | Freq: Every day | ORAL | Status: DC
  Administered 2021-08-26 – 2021-08-27 (×2): 1 via ORAL
  Filled 2021-08-26 (×2): qty 1

## 2021-08-26 MED ORDER — WITCH HAZEL-GLYCERIN EX PADS: 1.0000 "application " | MEDICATED_PAD | CUTANEOUS | Status: DC | PRN

## 2021-08-26 MED ORDER — ACETAMINOPHEN 325 MG PO TABS: 650.0000 mg | ORAL_TABLET | ORAL | Status: DC | PRN

## 2021-08-26 MED ORDER — IBUPROFEN 600 MG PO TABS
600.0000 mg | ORAL_TABLET | Freq: Four times a day (QID) | ORAL | Status: DC
  Administered 2021-08-26 – 2021-08-27 (×4): 600 mg via ORAL
  Filled 2021-08-26 (×5): qty 1

## 2021-08-26 MED ORDER — ONDANSETRON HCL 4 MG PO TABS: 4.0000 mg | ORAL_TABLET | ORAL | Status: DC | PRN

## 2021-08-26 MED ORDER — ONDANSETRON HCL 4 MG/2ML IJ SOLN: 4.0000 mg | INTRAMUSCULAR | Status: DC | PRN

## 2021-08-26 MED ORDER — SENNOSIDES-DOCUSATE SODIUM 8.6-50 MG PO TABS
2.0000 | ORAL_TABLET | Freq: Every day | ORAL | Status: DC
  Administered 2021-08-27: 2 via ORAL
  Filled 2021-08-26 (×2): qty 2

## 2021-08-26 MED ORDER — BENZOCAINE-MENTHOL 20-0.5 % EX AERO: 1.0000 "application " | INHALATION_SPRAY | CUTANEOUS | Status: DC | PRN

## 2021-08-26 MED ORDER — DIPHENHYDRAMINE HCL 25 MG PO CAPS: 25.0000 mg | ORAL_CAPSULE | Freq: Four times a day (QID) | ORAL | Status: DC | PRN

## 2021-08-26 NOTE — Progress Notes (Signed)
Post Partum Day # 1, s/p SVD  Subjective: no complaints, up ad lib, voiding, and tolerating PO  Objective: Temp:  [97.7 F (36.5 C)-98.9 F (37.2 C)] 98.5 F (36.9 C) (09/19 0719) Pulse Rate:  [55-75] 62 (09/19 0719) Resp:  [16-20] 20 (09/19 0719) BP: (116-142)/(69-84) 131/80 (09/19 0719) SpO2:  [98 %-100 %] 98 % (09/19 0719) Weight:  [74.8 kg] 74.8 kg (09/18 1512)  Physical Exam:  General: alert and no distress  Lungs: clear to auscultation bilaterally Breasts: normal appearance, no masses or tenderness Heart: regular rate and rhythm, S1, S2 normal, no murmur, click, rub or gallop Abdomen: soft, non-tender; bowel sounds normal; no masses,  no organomegaly Pelvis: Lochia: appropriate, Uterine Fundus: firm Extremities: DVT Evaluation: No evidence of DVT seen on physical exam. Negative Homan's sign. No cords or calf tenderness. No significant calf/ankle edema.   Recent Labs    08/25/21 1725 08/26/21 0509  HGB 10.8* 10.8*  HCT 31.6* 31.7*    Assessment/Plan: Doing well postpartum Breastfeeding, Lactation consult,  Contraception Xulane patch Social work consult for lack of PNC and marijuana use this pregnancy Plan for discharge tomorrow   LOS: 1 day   Hildred Laser, MD [facility] 08/26/2021 9:50 AM

## 2021-08-26 NOTE — Plan of Care (Signed)
  Problem: Education: Goal: Knowledge of General Education information will improve Description: Including pain rating scale, medication(s)/side effects and non-pharmacologic comfort measures Outcome: Progressing   Problem: Clinical Measurements: Goal: Ability to maintain clinical measurements within normal limits will improve Outcome: Progressing   

## 2021-08-26 NOTE — TOC Initial Note (Addendum)
Transition of Care [facility]) - Initial/Assessment Note    Patient Details  Name: Betty Chavez MRN: [id number] Date of Birth: 06/14/1996  Transition of Care [facility]) CM/SW Contact:    [place]-Varina Cellar, RN Phone Number: 08/26/2021, 9:21 AM  Clinical Narrative:                 Spoke with patient at bedside with MOB holding infant. MOB reports she is feeling great and enjoying the rest. Has 4 other children at home. Lives with children and FOB. Has strong support system with mother and mother in law. No transportation needs and infant will be seen by [facility] with the other children. No equipment needs. No history of PPD or anxiety. Discussed PPD and resources available if needed. MOB confirmed smoking MJ and not receiving PNC stating she did not feel the need and followed up with the ED when she started bleeding. Understands need for CPS report. MOB reports she will be bottlefeeding and will need [facility] services. Already engaged with [facility] from other children but has not notified them of this pregnancy. No other needs at this time.   LVMM for [facility] DSS/CPS        Patient Goals and CMS Choice        Expected Discharge Plan and Services                                                Prior Living Arrangements/Services                       Activities of Daily Living Home Assistive Devices/Equipment: None ADL Screening (condition at time of admission) Patient's cognitive ability adequate to safely complete daily activities?: Yes Is the patient deaf or have difficulty hearing?: No Does the patient have difficulty seeing, even when wearing glasses/contacts?: No Does the patient have difficulty concentrating, remembering, or making decisions?: No Patient able to express need for assistance with ADLs?: Yes Does the patient have difficulty dressing or bathing?: No Independently performs ADLs?: Yes (appropriate for developmental age) Does the patient have difficulty walking or  climbing stairs?: No Weakness of Legs: None Weakness of Arms/Hands: None  Permission Sought/Granted                  Emotional Assessment              Admission diagnosis:  Indication for care in labor or delivery [O75.9] PROM (premature rupture of membranes) [O42.90] Patient Active Problem List   Diagnosis Date Noted   Indication for care in labor or delivery 08/25/2021   PROM (premature rupture of membranes) 08/25/2021   Indication for care in labor and delivery, antepartum 07/06/2021   Labor, precipitous 12/19/2019   Uterine contractions during pregnancy 05/02/2017   Marijuana use 12/10/2016   Encounter for supervision of normal pregnancy in third trimester 12/02/2016   Insufficient prenatal care, second trimester 12/02/2016   PCP:  [facility], [facility]:   [facility] [address], [place] - [address] [place] [address] Phone: [phone] Fax: [phone]  [id number] [place] [place] - [address] [place] [address] Phone: [phone] Fax: [phone]     Social Determinants of Health (SDOH) Interventions    Readmission Risk Interventions No flowsheet data found.

## 2021-08-26 NOTE — TOC Progression Note (Signed)
Transition of Care [facility]) - Progression Note    Patient Details  Name: Betty Chavez MRN: [id number] Date of Birth: Jan 14, 1996  Transition of Care [facility]) CM/SW Contact  [place] Cellar, RN Phone Number: 08/26/2021, 9:49 AM  Clinical Narrative:    Completed CPS report with Hailey @ [facility] DSS. No other needs.         Expected Discharge Plan and Services                                                 Social Determinants of Health (SDOH) Interventions    Readmission Risk Interventions No flowsheet data found.

## 2021-08-27 LAB — VARICELLA ZOSTER ANTIBODY, IGG: Varicella IgG: <135 index — ABNORMAL LOW (ref >165)

## 2021-08-27 LAB — MEASLES/MUMPS/RUBELLA IMMUNITY
Mumps IgG: 192 AU/mL (ref >10.9)
Rubella: 5.7 index (ref >0.99)
Rubeola IgG: 40.6 AU/mL (ref >16.4)

## 2021-08-27 MED ORDER — MEDROXYPROGESTERONE ACETATE 150 MG/ML IM SUSP
150.0000 mg | Freq: Once | INTRAMUSCULAR | Status: AC
  Administered 2021-08-27: 150 mg via INTRAMUSCULAR
  Filled 2021-08-27: qty 1

## 2021-08-27 NOTE — Progress Notes (Signed)
Post Partum Day # 2, s/p SVD  Subjective: no complaints, up ad lib, voiding, and tolerating PO.  Is working on breastfeeding, so far going fairly well. Bleeding is light.   Objective: Vitals:   08/26/21 1200 08/26/21 1500 08/26/21 2307 08/27/21 0748  BP: 118/74 129/73 127/66 129/79  Pulse:  84 78 63  Resp: 18 18 18 20   Temp: 98.1 F (36.7 C) 98.3 F (36.8 C) 97.6 F (36.4 C) 98.2 F (36.8 C)  TempSrc: Oral Oral Oral Oral  SpO2: 99% 100% 99% 98%  Weight:      Height:         Physical Exam:  General: alert and no distress  Lungs: clear to auscultation bilaterally Breasts: normal appearance, no masses or tenderness Heart: regular rate and rhythm, S1, S2 normal, no murmur, click, rub or gallop Abdomen: soft, non-tender; bowel sounds normal; no masses,  no organomegaly Pelvis: Lochia: appropriate, Uterine Fundus: firm Extremities: DVT Evaluation: No evidence of DVT seen on physical exam. Negative Homan's sign. No cords or calf tenderness. No significant calf/ankle edema.   Recent Labs    08/25/21 1725 08/26/21 0509  HGB 10.8* 10.8*  HCT 31.6* 31.7*     Assessment/Plan: Doing well postpartum Breastfeeding, Lactation consult,  Contraception Xulane patch. Desires to receive Depo Provera until postpartum visit.  S/p Social work consult for lack of [facility] and marijuana use this pregnancy Plan for discharge today.    LOS: 2 days   [facility], MD [facility] Care 08/27/2021 8:56 AM

## 2021-08-27 NOTE — Discharge Summary (Signed)
Postpartum Discharge Summary     Patient Name: Betty Chavez DOB: November 16, 1996 MRN: [id number]  Date of admission: 08/25/2021 Delivery date:08/25/2021  Delivering provider: Rubie Maid  Date of discharge: 08/27/2021  Admitting diagnosis: Indication for care in labor or delivery [O75.9] PROM (premature rupture of membranes) [O42.90] Intrauterine pregnancy: [redacted]w[redacted]d    Secondary diagnosis:  Active Problems:   Indication for care in labor or delivery   PROM (premature rupture of membranes)   No prenatal care in current pregnancy  Additional problems: None    Discharge diagnosis: Term Pregnancy Delivered                                              Post partum procedures: None Augmentation: None Complications: None  Hospital course: Onset of Labor With Vaginal Delivery      25y.o. yo [id number] admitted in Latent Labor with PROM on 08/25/2021. Patient had an uncomplicated labor course as follows:  Membrane Rupture Time/Date: 2:30 PM ,08/25/2021   Delivery Method:Vaginal, Spontaneous  Episiotomy:  None Lacerations:   None Patient had an uncomplicated postpartum course.  She is ambulating, tolerating a regular diet, passing flatus, and urinating well. Patient is discharged home in stable condition on 08/28/21.  Newborn Data: Birth date:08/25/2021  Birth time:9:24 PM  Gender:Female  Living status:Living  Apgars:8 ,9  Weight:2990 g   Magnesium Sulfate received: No BMZ received: No Rhophylac:No MMR:No T-DaP: Declined Flu: Declined Transfusion:No  Physical exam  Vitals:   08/26/21 1200 08/26/21 1500 08/26/21 2307 08/27/21 0748  BP: 118/74 129/73 127/66 129/79  Pulse:  84 78 63  Resp: _0 Temp: 98.1 F (36.7 C) 98.3 F (36.8 C) 97.6 F (36.4 C) 98.2 F (36.8 C)  TempSrc: Oral Oral Oral Oral  SpO2: 99% 100% 99% 98%  Weight:      Height:       General: alert, cooperative, and no distress Lochia: appropriate Uterine Fundus: firm Incision:  N/A DVT Evaluation: No evidence of DVT seen on physical exam. Negative Homan's sign. No cords or calf tenderness. No significant calf/ankle edema. Labs: Lab Results  Component Value Date   WBC 11.0 (H) 08/26/2021   HGB 10.8 (L) 08/26/2021   HCT 31.7 (L) 08/26/2021   MCV 82.8 08/26/2021   PLT 212 08/26/2021   CMP Latest Ref Rng & Units 07/06/2021  Glucose 70 - 99 mg/dL 92  BUN 6 - 20 mg/dL 5(L)  Creatinine 0.44 - 1.00 mg/dL 0.60  Sodium 135 - 145 mmol/L 135  Potassium 3.5 - 5.1 mmol/L 4.0  Chloride 98 - 111 mmol/L 104  CO2 22 - 32 mmol/L 26  Calcium 8.9 - 10.3 mg/dL 8.8(L)  Total Protein 6.5 - 8.1 g/dL 6.4(L)  Total Bilirubin 0.3 - 1.2 mg/dL 0.6  Alkaline Phos 38 - 126 U/L 73  AST 15 - 41 U/L 17  ALT 0 - 44 U/L 16   Edinburgh Score: Edinburgh Postnatal Depression Scale Screening Tool 08/26/2021  I have been able to laugh and see the funny side of things. 0  I have looked forward with enjoyment to things. 0  I have blamed myself unnecessarily when things went wrong. 0  I have been anxious or worried for no good reason. 0  I have felt scared or panicky for no good reason. 0  Things have  been getting on top of me. 1  I have been so unhappy that I have had difficulty sleeping. 0  I have felt sad or miserable. 0  I have been so unhappy that I have been crying. 0  The thought of harming myself has occurred to me. 0  Edinburgh Postnatal Depression Scale Total 1      After visit meds:  Allergies as of 08/27/2021   No Known Allergies      Medication List     TAKE these medications    ibuprofen 800 MG tablet Commonly known as: ADVIL Take 1 tablet (800 mg total) by mouth every 8 (eight) hours as needed.   Prenatal Vitamin 27-0.8 MG Tabs Take 1 tablet by mouth daily at 6 (six) AM.         Discharge home in stable condition Infant Feeding: Bottle and Breast Infant Disposition:home with mother Discharge instruction: per After Visit Summary and Postpartum  booklet. Activity: Advance as tolerated. Pelvic rest for 6 weeks.  Diet: routine diet Anticipated Birth Control:  Contraceptive patch , however given dose of Depo Provera at discharge  Postpartum Appointment:6 weeks Additional Postpartum F/U: Postpartum Depression checkup Future Appointments:No future appointments. Follow up Visit:  Follow-up Information     Rubie Maid, MD. Schedule an appointment as soon as possible for a visit.   Specialties: Obstetrics and Gynecology, Radiology Why: 2-3 week postpartum video visit (mood check) 6 week postpartum visit in office Contact information: [address] [place] [address] [phone]                     08/27/2021 Rubie Maid, MD

## 2021-08-27 NOTE — Progress Notes (Signed)
Pt d/c home in the company of baby. D/c instructions reviewed with pt, she verbalized understanding.

## 2021-08-28 ENCOUNTER — Telehealth: Payer: Self-pay

## 2021-08-28 DIAGNOSIS — O0930 Supervision of pregnancy with insufficient antenatal care, unspecified trimester: Secondary | ICD-10-CM

## 2021-08-28 NOTE — Telephone Encounter (Signed)
Transition Care Management Follow-up Telephone Call Date of discharge and from where: 08/27/2021-[facility] How have you been since you were released from the hospital? Patient stated she is doing fine Any questions or concerns? No  Items Reviewed: Did the pt receive and understand the discharge instructions provided? Yes  Medications obtained and verified? Yes  Other? No  Any new allergies since your discharge? No  Dietary orders reviewed? N/A Do you have support at home? Yes   Home Care and Equipment/Supplies: Were home health services ordered? not applicable If so, what is the name of the agency? N/A  Has the agency set up a time to come to the patient's home? not applicable Were any new equipment or medical supplies ordered?  No What is the name of the medical supply agency? N/A Were you able to get the supplies/equipment? not applicable Do you have any questions related to the use of the equipment or supplies? No  Functional Questionnaire: (I = Independent and D = Dependent) ADLs: I  Bathing/Dressing- I  Meal Prep- I  Eating- I  Maintaining continence- I  Transferring/Ambulation- I  Managing Meds- I  Follow up appointments reviewed:  PCP Hospital f/u appt confirmed? Yes  Scheduled to see Dr. Valentino Saxon on 09/18/2021 @ 10:00am. Specialist Hospital f/u appt confirmed? No   Are transportation arrangements needed? No  If their condition worsens, is the pt aware to call PCP or go to the Emergency Dept.? Yes Was the patient provided with contact information for the PCP's office or ED? Yes Was to pt encouraged to call back with questions or concerns? Yes

## 2021-09-17 NOTE — Progress Notes (Deleted)
   OBSTETRICS POSTPARTUM CLINIC PROGRESS NOTE  Subjective:     Betty Chavez is a 25 y.o. [id number] female who presents for a postpartum visit. She is 2 week postpartum following a spontaneous vaginal delivery. I have fully reviewed the prenatal and intrapartum course. The delivery was at 37 gestational weeks.  Anesthesia: none. Postpartum course has been ***. Baby's course has been ***. Baby is feeding by {breast/bottle:69}. Bleeding: patient {HAS HAS YVO:59292} not resumed menses, with No LMP recorded.. Bowel function is {normal:32111}. Bladder function is {normal:32111}. Patient {is/is not:9024} sexually active. Contraception method desired is {contraceptive method:5051}. Postpartum depression screening: {neg default:13464::"negative"}.  EDPS score is ***.    The following portions of the patient's history were reviewed and updated as appropriate: allergies, current medications, past family history, past medical history, past social history, past surgical history, and problem list.  Review of Systems {ros; complete:30496}   Objective:    There were no vitals taken for this visit.  General:  alert and no distress   Breasts:  inspection negative, no nipple discharge or bleeding, no masses or nodularity palpable  Lungs: clear to auscultation bilaterally  Heart:  regular rate and rhythm, S1, S2 normal, no murmur, click, rub or gallop  Abdomen: soft, non-tender; bowel sounds normal; no masses,  no organomegaly.  ***Well healed Pfannenstiel incision   Vulva:  normal  Vagina: normal vagina, no discharge, exudate, lesion, or erythema  Cervix:  no cervical motion tenderness and no lesions  Corpus: normal size, contour, position, consistency, mobility, non-tender  Adnexa:  normal adnexa and no mass, fullness, tenderness  Rectal Exam: Not performed.         Labs:  Lab Results  Component Value Date   HGB 10.8 (L) 08/26/2021     Assessment:   No diagnosis found.   Plan:    1.  Contraception: {method:5051} 2. Will check Hgb for h/o postpartum anemia of less than 10.  3. Follow up in: 4 month or as needed.    Hildred Laser, MD [facility]

## 2021-09-18 ENCOUNTER — Encounter: Payer: Self-pay | Admitting: Obstetrics and Gynecology

## 2021-09-18 ENCOUNTER — Ambulatory Visit: Payer: Medicaid Other | Admitting: Obstetrics and Gynecology

## 2021-10-08 ENCOUNTER — Encounter: Payer: Self-pay | Admitting: Obstetrics and Gynecology

## 2021-10-08 ENCOUNTER — Encounter: Payer: Medicaid Other | Admitting: Obstetrics and Gynecology

## 2022-01-13 ENCOUNTER — Ambulatory Visit: Payer: Medicaid Other

## 2022-03-13 ENCOUNTER — Emergency Department: Payer: Medicaid Other

## 2022-03-13 ENCOUNTER — Other Ambulatory Visit: Payer: Self-pay

## 2022-03-13 ENCOUNTER — Emergency Department
Admission: EM | Admit: 2022-03-13 | Discharge: 2022-03-13 | Disposition: A | Discharge status: Home / Self Care | Payer: Medicaid Other | Attending: Student in an Organized Health Care Education/Training Program | Admitting: Student in an Organized Health Care Education/Training Program

## 2022-03-13 ENCOUNTER — Encounter: Payer: Self-pay | Admitting: Emergency Medicine

## 2022-03-13 DIAGNOSIS — M25532 Pain in left wrist: Secondary | ICD-10-CM | POA: Diagnosis not present

## 2022-03-13 DIAGNOSIS — S6992XA Unspecified injury of left wrist, hand and finger(s), initial encounter: Secondary | ICD-10-CM | POA: Diagnosis present

## 2022-03-13 DIAGNOSIS — M7989 Other specified soft tissue disorders: Secondary | ICD-10-CM | POA: Diagnosis not present

## 2022-03-13 DIAGNOSIS — S52592A Other fractures of lower end of left radius, initial encounter for closed fracture: Secondary | ICD-10-CM | POA: Insufficient documentation

## 2022-03-13 DIAGNOSIS — S52502A Unspecified fracture of the lower end of left radius, initial encounter for closed fracture: Secondary | ICD-10-CM | POA: Diagnosis not present

## 2022-03-13 MED ORDER — OXYCODONE-ACETAMINOPHEN 5-325 MG PO TABS
1.0000 | ORAL_TABLET | Freq: Once | ORAL | Status: AC
  Administered 2022-03-13: 1 via ORAL
  Filled 2022-03-13: qty 1

## 2022-03-13 MED ORDER — OXYCODONE-ACETAMINOPHEN 5-325 MG PO TABS: 1.0000 | ORAL_TABLET | ORAL | 0 refills | Status: AC | PRN

## 2022-03-13 MED ORDER — OXYCODONE-ACETAMINOPHEN 5-325 MG PO TABS
1.0000 | ORAL_TABLET | Freq: Once | ORAL | Status: DC
  Filled 2022-03-13: qty 1
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
## 2022-03-13 NOTE — ED Notes (Signed)
This tech applied splint and shoulder immobilizer. Pt given instructions and verbalized understanding.  ?

## 2022-03-13 NOTE — ED Notes (Signed)
Pt now requesting pain meds prior to splint being applied. Pt states that she does have a ride home. MD notified.  ?

## 2022-03-14 ENCOUNTER — Telehealth: Payer: Self-pay

## 2022-03-14 NOTE — Telephone Encounter (Signed)
Transition Care Management Unsuccessful Follow-up Telephone Call ? ?Date of discharge and from where:  03/13/2022-[facility] ? ?Attempts:  1st Attempt ? ?Reason for unsuccessful TCM follow-up call:  Left voice message ? ?  ?

## 2022-03-18 NOTE — Telephone Encounter (Signed)
Transition Care Management Follow-up Telephone Call ?Date of discharge and from where: 03/13/2022-[facility] ?How have you been since you were released from the hospital? Pt stated she is doing fine.  ?Any questions or concerns? No ? ?Items Reviewed: ?Did the pt receive and understand the discharge instructions provided? Yes  ?Medications obtained and verified? Yes  ?Other? No  ?Any new allergies since your discharge? No  ?Dietary orders reviewed? No ?Do you have support at home? Yes  ? ?Home Care and Equipment/Supplies: ?Were home health services ordered? not applicable ?If so, what is the name of the agency? N/A  ?Has the agency set up a time to come to the patient's home? not applicable ?Were any new equipment or medical supplies ordered?  No ?What is the name of the medical supply agency? N/A ?Were you able to get the supplies/equipment? not applicable ?Do you have any questions related to the use of the equipment or supplies? No ? ?Functional Questionnaire: (I = Independent and D = Dependent) ?ADLs: I ? ?Bathing/Dressing- I ? ?Meal Prep- I ? ?Eating- I ? ?Maintaining continence- I ? ?Transferring/Ambulation- I ? ?Managing Meds- I ? ?Follow up appointments reviewed: ? ?PCP Hospital f/u appt confirmed? No   ?Specialist Hospital f/u appt confirmed? No   ?Are transportation arrangements needed? No  ?If their condition worsens, is the pt aware to call PCP or go to the Emergency Dept.? Yes ?Was the patient provided with contact information for the PCP's office or ED? Yes ?Was to pt encouraged to call back with questions or concerns? Yes  ?

## 2022-12-08 NOTE — L&D Delivery Note (Signed)
Delivery Note  Date of delivery: 06/04/2023 Estimated Date of Delivery: 06/09/23 No LMP recorded. Patient is pregnant. EGA: [redacted]w[redacted]d  Delivery Note At 4:41 AM a viable female was delivered via Vaginal, Spontaneous (Presentation: Right Occiput Anterior).  APGAR: 8, 9; weight 8 lb 8.9 oz (3880 g).  Placenta status: Spontaneous, Intact.  Cord: 3 vessels with the following complications: None.    First Stage: Labor onset: 0230 Augmentation : None  Analgesia /Anesthesia intrapartum: None SROM at [employer] presented to L&D with SROM and active labor. She was expectantly managed.   Second Stage: Complete dilation at 0433 Onset of pushing at 0433 FHR second stage Cat I Delivery at 0441 on 06/04/2023  She progressed to complete and had a spontaneous vaginal birth of a live female over an intact perineum. The fetal head was delivered in OA position with restitution to ROA. No nuchal cord. Anterior then posterior shoulders delivered spontaneously. Baby placed on mom's abdomen and attended to by transition RN. Cord clamped and cut after 1+ min by pt's visitor. Cord blood obtained for newborn labs.  Third Stage: Placenta delivered intact with 3VC at 0445 Placenta disposition: Routine disposal Uterine tone firm / bleeding min IV pitocin given for hemorrhage prophylaxis  Anesthesia: None Episiotomy: None Lacerations: None Suture Repair: none Est. Blood Loss (mL):  100  Complications: none  Mom to postpartum.  Baby to Couplet care / Skin to Skin.  Newborn: Birth Weight: 8 lb 8.9 oz (3880 g).  Apgar Scores: 8, 9 Feeding planned: bottle   Cyril Mourning, CNM 06/04/2023 4:59 AM

## 2023-01-05 DIAGNOSIS — Z3490 Encounter for supervision of normal pregnancy, unspecified, unspecified trimester: Secondary | ICD-10-CM | POA: Diagnosis not present

## 2023-01-13 DIAGNOSIS — O99210 Obesity complicating pregnancy, unspecified trimester: Secondary | ICD-10-CM | POA: Diagnosis not present

## 2023-01-13 DIAGNOSIS — Z114 Encounter for screening for human immunodeficiency virus [HIV]: Secondary | ICD-10-CM | POA: Diagnosis not present

## 2023-01-13 DIAGNOSIS — Z3482 Encounter for supervision of other normal pregnancy, second trimester: Secondary | ICD-10-CM | POA: Diagnosis not present

## 2023-01-29 DIAGNOSIS — Z3482 Encounter for supervision of other normal pregnancy, second trimester: Secondary | ICD-10-CM | POA: Diagnosis not present

## 2023-01-29 DIAGNOSIS — O26892 Other specified pregnancy related conditions, second trimester: Secondary | ICD-10-CM | POA: Diagnosis not present

## 2023-01-29 DIAGNOSIS — N898 Other specified noninflammatory disorders of vagina: Secondary | ICD-10-CM | POA: Diagnosis not present

## 2023-03-03 DIAGNOSIS — Z113 Encounter for screening for infections with a predominantly sexual mode of transmission: Secondary | ICD-10-CM | POA: Diagnosis not present

## 2023-03-16 DIAGNOSIS — Z3482 Encounter for supervision of other normal pregnancy, second trimester: Secondary | ICD-10-CM | POA: Diagnosis not present

## 2023-05-07 DIAGNOSIS — O26843 Uterine size-date discrepancy, third trimester: Secondary | ICD-10-CM | POA: Diagnosis not present

## 2023-05-13 DIAGNOSIS — Z113 Encounter for screening for infections with a predominantly sexual mode of transmission: Secondary | ICD-10-CM | POA: Diagnosis not present

## 2023-05-13 DIAGNOSIS — O26893 Other specified pregnancy related conditions, third trimester: Secondary | ICD-10-CM | POA: Diagnosis not present

## 2023-05-13 DIAGNOSIS — N898 Other specified noninflammatory disorders of vagina: Secondary | ICD-10-CM | POA: Diagnosis not present

## 2023-05-13 DIAGNOSIS — Z3483 Encounter for supervision of other normal pregnancy, third trimester: Secondary | ICD-10-CM | POA: Diagnosis not present

## 2023-05-13 DIAGNOSIS — Z114 Encounter for screening for human immunodeficiency virus [HIV]: Secondary | ICD-10-CM | POA: Diagnosis not present

## 2023-06-04 ENCOUNTER — Encounter: Payer: Self-pay | Admitting: Obstetrics and Gynecology

## 2023-06-04 ENCOUNTER — Other Ambulatory Visit: Payer: Self-pay

## 2023-06-04 ENCOUNTER — Inpatient Hospital Stay
Admission: EM | Admit: 2023-06-04 | Discharge: 2023-06-05 | DRG: 807 | Disposition: A | Discharge status: Home / Self Care | Payer: Medicaid Other | Attending: Obstetrics | Admitting: Obstetrics

## 2023-06-04 DIAGNOSIS — Z3A39 39 weeks gestation of pregnancy: Secondary | ICD-10-CM

## 2023-06-04 DIAGNOSIS — O99824 Streptococcus B carrier state complicating childbirth: Secondary | ICD-10-CM | POA: Diagnosis not present

## 2023-06-04 DIAGNOSIS — O26893 Other specified pregnancy related conditions, third trimester: Secondary | ICD-10-CM | POA: Diagnosis present

## 2023-06-04 DIAGNOSIS — O99892 Other specified diseases and conditions complicating childbirth: Secondary | ICD-10-CM | POA: Diagnosis not present

## 2023-06-04 DIAGNOSIS — O99214 Obesity complicating childbirth: Secondary | ICD-10-CM | POA: Diagnosis present

## 2023-06-04 DIAGNOSIS — O0933 Supervision of pregnancy with insufficient antenatal care, third trimester: Secondary | ICD-10-CM | POA: Diagnosis not present

## 2023-06-04 DIAGNOSIS — O9081 Anemia of the puerperium: Secondary | ICD-10-CM | POA: Diagnosis not present

## 2023-06-04 LAB — CBC
HCT: 34.3 % — ABNORMAL LOW (ref 36.0–46.0)
Hemoglobin: 11.2 g/dL — ABNORMAL LOW (ref 12.0–15.0)
MCH: 26.4 pg (ref 26.0–34.0)
MCHC: 32.7 g/dL (ref 30.0–36.0)
MCV: 80.7 fL (ref 80.0–100.0)
Platelets: 220 10*3/uL (ref 150–400)
RBC: 4.25 MIL/uL (ref 3.87–5.11)
RDW: 13.2 % (ref 11.5–15.5)
WBC: 7.9 10*3/uL (ref 4.0–10.5)
nRBC: 0 % (ref 0.0–0.2)

## 2023-06-04 LAB — TYPE AND SCREEN
ABO/RH(D): O POS
Antibody Screen: NEGATIVE

## 2023-06-04 LAB — RPR: RPR Ser Ql: NONREACTIVE

## 2023-06-04 MED ORDER — OXYCODONE HCL 5 MG PO TABS: 10.0000 mg | ORAL_TABLET | ORAL | Status: DC | PRN

## 2023-06-04 MED ORDER — ACETAMINOPHEN 325 MG PO TABS
650.0000 mg | ORAL_TABLET | ORAL | Status: DC | PRN
  Administered 2023-06-04 – 2023-06-05 (×2): 650 mg via ORAL
  Filled 2023-06-04 (×2): qty 2

## 2023-06-04 MED ORDER — SODIUM CHLORIDE 0.9 % IV SOLN
INTRAVENOUS | Status: AC
  Administered 2023-06-04: 5 10*6.[IU] via INTRAVENOUS
  Filled 2023-06-04: qty 5

## 2023-06-04 MED ORDER — LACTATED RINGERS IV SOLN: INTRAVENOUS | Status: DC

## 2023-06-04 MED ORDER — PRENATAL MULTIVITAMIN CH
1.0000 | ORAL_TABLET | Freq: Every day | ORAL | Status: DC
  Administered 2023-06-04 – 2023-06-05 (×2): 1 via ORAL
  Filled 2023-06-04 (×3): qty 1

## 2023-06-04 MED ORDER — DIPHENHYDRAMINE HCL 25 MG PO CAPS: 25.0000 mg | ORAL_CAPSULE | Freq: Four times a day (QID) | ORAL | Status: DC | PRN

## 2023-06-04 MED ORDER — SENNOSIDES-DOCUSATE SODIUM 8.6-50 MG PO TABS
2.0000 | ORAL_TABLET | Freq: Every day | ORAL | Status: DC
  Administered 2023-06-05: 2 via ORAL
  Filled 2023-06-04: qty 2

## 2023-06-04 MED ORDER — SODIUM CHLORIDE 0.9 % IV SOLN: 5.0000 10*6.[IU] | Freq: Once | INTRAVENOUS | Status: AC

## 2023-06-04 MED ORDER — IBUPROFEN 600 MG PO TABS
600.0000 mg | ORAL_TABLET | Freq: Four times a day (QID) | ORAL | Status: DC
  Administered 2023-06-04 – 2023-06-05 (×6): 600 mg via ORAL
  Filled 2023-06-04 (×6): qty 1

## 2023-06-04 MED ORDER — ACETAMINOPHEN 325 MG PO TABS: 650.0000 mg | ORAL_TABLET | ORAL | Status: DC | PRN

## 2023-06-04 MED ORDER — OXYTOCIN BOLUS FROM INFUSION
333.0000 mL | Freq: Once | INTRAVENOUS | Status: AC
  Administered 2023-06-04: 333 mL via INTRAVENOUS

## 2023-06-04 MED ORDER — TETANUS-DIPHTH-ACELL PERTUSSIS 5-2.5-18.5 LF-MCG/0.5 IM SUSY: 0.5000 mL | PREFILLED_SYRINGE | Freq: Once | INTRAMUSCULAR | Status: DC

## 2023-06-04 MED ORDER — OXYTOCIN-SODIUM CHLORIDE 30-0.9 UT/500ML-% IV SOLN
2.5000 [IU]/h | INTRAVENOUS | Status: DC
  Filled 2023-06-04: qty 500

## 2023-06-04 MED ORDER — ONDANSETRON HCL 4 MG/2ML IJ SOLN: 4.0000 mg | INTRAMUSCULAR | Status: DC | PRN

## 2023-06-04 MED ORDER — LACTATED RINGERS IV SOLN: 500.0000 mL | INTRAVENOUS | Status: DC | PRN

## 2023-06-04 MED ORDER — OXYCODONE HCL 5 MG PO TABS: 5.0000 mg | ORAL_TABLET | ORAL | Status: DC | PRN

## 2023-06-04 MED ORDER — OXYCODONE-ACETAMINOPHEN 5-325 MG PO TABS: 2.0000 | ORAL_TABLET | ORAL | Status: DC | PRN

## 2023-06-04 MED ORDER — ONDANSETRON HCL 4 MG/2ML IJ SOLN
4.0000 mg | Freq: Four times a day (QID) | INTRAMUSCULAR | Status: DC | PRN
  Administered 2023-06-04: 4 mg via INTRAVENOUS
  Filled 2023-06-04: qty 2

## 2023-06-04 MED ORDER — ONDANSETRON HCL 4 MG PO TABS: 4.0000 mg | ORAL_TABLET | ORAL | Status: DC | PRN

## 2023-06-04 MED ORDER — SIMETHICONE 80 MG PO CHEW: 80.0000 mg | CHEWABLE_TABLET | ORAL | Status: DC | PRN

## 2023-06-04 MED ORDER — FENTANYL CITRATE (PF) 100 MCG/2ML IJ SOLN
50.0000 ug | INTRAMUSCULAR | Status: DC | PRN
  Administered 2023-06-04: 100 ug via INTRAVENOUS
  Filled 2023-06-04: qty 2

## 2023-06-04 MED ORDER — OXYCODONE-ACETAMINOPHEN 5-325 MG PO TABS: 1.0000 | ORAL_TABLET | ORAL | Status: DC | PRN

## 2023-06-04 MED ORDER — DIBUCAINE (PERIANAL) 1 % EX OINT: 1.0000 | TOPICAL_OINTMENT | CUTANEOUS | Status: DC | PRN

## 2023-06-04 MED ORDER — MEASLES, MUMPS & RUBELLA VAC IJ SOLR
0.5000 mL | INTRAMUSCULAR | Status: DC | PRN
  Filled 2023-06-04: qty 0.5

## 2023-06-04 MED ORDER — FERROUS SULFATE 325 (65 FE) MG PO TABS
325.0000 mg | ORAL_TABLET | Freq: Two times a day (BID) | ORAL | Status: DC
  Administered 2023-06-04 – 2023-06-05 (×3): 325 mg via ORAL
  Filled 2023-06-04 (×3): qty 1

## 2023-06-04 MED ORDER — VARICELLA VIRUS VACCINE LIVE 1350 PFU/0.5ML IJ SUSR
0.5000 mL | INTRAMUSCULAR | Status: DC | PRN
  Filled 2023-06-04: qty 0.5

## 2023-06-04 MED ORDER — LIDOCAINE HCL (PF) 1 % IJ SOLN: 30.0000 mL | INTRAMUSCULAR | Status: DC | PRN

## 2023-06-04 MED ORDER — BENZOCAINE-MENTHOL 20-0.5 % EX AERO: 1.0000 | INHALATION_SPRAY | CUTANEOUS | Status: DC | PRN

## 2023-06-04 MED ORDER — WITCH HAZEL-GLYCERIN EX PADS: 1.0000 | MEDICATED_PAD | CUTANEOUS | Status: DC | PRN

## 2023-06-04 MED ORDER — COCONUT OIL OIL: 1.0000 | TOPICAL_OIL | Status: DC | PRN

## 2023-06-04 MED ORDER — PENICILLIN G POT IN DEXTROSE 60000 UNIT/ML IV SOLN
3.0000 10*6.[IU] | INTRAVENOUS | Status: DC
  Filled 2023-06-04 (×2): qty 50

## 2023-06-04 MED ORDER — SOD CITRATE-CITRIC ACID 500-334 MG/5ML PO SOLN: 30.0000 mL | ORAL | Status: DC | PRN

## 2023-06-04 NOTE — H&P (Signed)
OB History & Physical   History of Present Illness:  Chief Complaint:   HPI:  Betty Chavez is a 27 y.o. [id number] female at [redacted]w[redacted]d dated by LMP.  She presents to L&D for SROM and active labor.  She reports:  -active fetal movement -LOF/SROM at 0200 -no vaginal bleeding -onset of contractions at 0200   Pregnancy Issues: 1. GBS POS 2. Anemia in pregnancy  3. Late to Care 4. Obesity BMI: _30.85_ 5. Varicella Non Immune 6. Rubella Non Immune 7. Trichomonas on Wet prep 01/29/23   Maternal Medical History:   Past Medical History:  Diagnosis Date   Anemia    Depression    pt states PP depression after all deliveries but never reported   Headache    states headaches with 2018 pregnancy   History of urinary tract infection    Infection 2018   severe wound infection from spider bite   Medical history non-contributory    Spontaneous abortion 2014    Past Surgical History:  Procedure Laterality Date   NO PAST SURGERIES      No Known Allergies  Prior to Admission medications   Medication Sig Start Date End Date Taking? Authorizing Provider  ibuprofen (ADVIL) 800 MG tablet Take 1 tablet (800 mg total) by mouth every 8 (eight) hours as needed. Patient not taking: No sig reported 12/20/19   Hildred Laser, MD  Prenatal Vit-Fe Fumarate-FA (PRENATAL VITAMIN) 27-0.8 MG TABS Take 1 tablet by mouth daily at 6 (six) AM. 04/26/21   Federico Flake, MD     Prenatal care site: [facility]  Social History: She  reports that she has never smoked. She has never been exposed to tobacco smoke. She has never used smokeless tobacco. She reports that she does not currently use alcohol. She reports current drug use. Drug: Marijuana.  Family History: family history includes Alcohol abuse in her maternal grandfather; Cirrhosis in her maternal grandfather; Healthy in her brother, brother, brother, daughter, daughter, daughter, father, maternal grandmother, mother, sister, sister,  and son.   Review of Systems: A full review of systems was performed and negative except as noted in the HPI.    Physical Exam:  Vital Signs: BP 120/72   Pulse 84   General:   alert and cooperative  Skin:  normal  Neurologic:    Alert & oriented x 3  Lungs:    N; effort  Heart:   regular rate and rhythm  Abdomen:  soft, non-tender; bowel sounds normal; no masses,  no organomegaly  Extremities: : non-tender, symmetric, no edema bilaterally.      Pertinent Results:  Prenatal Labs: Blood type/Rh O pos  Antibody screen neg  Rubella Non-Immune  Varicella Non-Immune  RPR NR  HBsAg Neg  HIV NR  GC neg  Chlamydia neg  Genetic screening negative  1 hour GTT 67  3 hour GTT   GBS pos   FHT: FHR: 130 bpm, variability: moderate,  accelerations:  Present,  decelerations:  Absent Category/reactivity:  Category I TOCO: regular, every 2-4 minutes SVE: Dilation: 5 / Effacement (%): 80 / Station: -1       Assessment:  Betty Chavez is a 27 y.o. G63P4015 female at [redacted]w[redacted]d with SROM and active labor.   Plan:  1. Admit to Labor & Delivery; consents reviewed and obtained  2. Fetal Well being  - Fetal Tracing: Cat I - GBS pod - Presentation: vtx confirmed by sve   3. Routine OB: - Prenatal labs  reviewed, as above - Rh pos - CBC & T&S on admit - Clear fluids, IVF  4. Monitoring of Labor -  Contractions by external toco in place -  Pelvis proven to 4170g -  Plan for continuous fetal monitoring  -  Maternal pain control as desired: IVPM, nitrous, regional anesthesia - Anticipate vaginal delivery  5. Post Partum Planning: - Infant feeding: Bottle - Contraception: Depo - Tdap: declined  Corley Maffeo, CNM 06/04/2023 3:25 AM

## 2023-06-04 NOTE — OB Triage Note (Signed)
Called J. Beckey Downing, CNM at [phone] and notified her of patient's arrival and SBAR given. New orders for admission will be entered into computer per provider to include Starting IV abx for GBS + and moving to labor room.

## 2023-06-04 NOTE — Discharge Summary (Signed)
Obstetrical Discharge Summary  Patient Name: Betty Chavez DOB: 1996-02-13 MRN: [id number]  Date of Admission: 06/04/2023 Date of Delivery: 06/04/23 Delivered by: Haroldine Laws, CNM  Date of Discharge: 06/05/2023  Primary OB: [facility] LMP:No LMP recorded. EDC Estimated Date of Delivery: 06/09/23 Gestational Age at Delivery: [redacted]w[redacted]d   Antepartum complications:  1. GBS POS 2. Anemia in pregnancy  3. Late to Care 4. Obesity BMI: _30.85_ 5. Varicella Non Immune 6. Rubella Non Immune 7. Trichomonas on Wet prep 01/29/23   Admitting Diagnosis: Normal labor and delivery [O80]  Secondary Diagnosis: Patient Active Problem List   Diagnosis Date Noted   Normal labor and delivery 06/04/2023   NSVD (normal spontaneous vaginal delivery) 06/04/2023   No prenatal care in current pregnancy 08/28/2021   Indication for care in labor or delivery 08/25/2021   PROM (premature rupture of membranes) 08/25/2021   Labor, precipitous 12/19/2019   Uterine contractions during pregnancy 05/02/2017   Marijuana use 12/10/2016    Discharge Diagnosis: Term Pregnancy Delivered      Augmentation: N/A Complications: None Intrapartum complications/course: see delivery note Delivery Type: spontaneous vaginal delivery Anesthesia: non-pharmacological methods Placenta: spontaneous To Pathology: No  Laceration: none Episiotomy: none Newborn Data: Live born female  Birth Weight: 8 lb 8.9 oz (3880 g) APGAR: 8, 9  Newborn Delivery   Birth date/time: 06/04/2023 04:41:00 Delivery type: Vaginal, Spontaneous      Postpartum Procedures: none Edinburgh:     06/04/2023    9:01 PM 08/26/2021    8:08 AM 12/20/2019    1:20 AM  Edinburgh Postnatal Depression Scale Screening Tool  I have been able to laugh and see the funny side of things. 0 0 0  I have looked forward with enjoyment to things. 1 0 0  I have blamed myself unnecessarily when things went wrong. 0 0 0  I have been anxious or worried for no  good reason. 1 0 0  I have felt scared or panicky for no good reason. 0 0 0  Things have been getting on top of me. 2 1 0  I have been so unhappy that I have had difficulty sleeping. 0 0 0  I have felt sad or miserable. 0 0 0  I have been so unhappy that I have been crying. 0 0 0  The thought of harming myself has occurred to me. 0 0 0  Edinburgh Postnatal Depression Scale Total 4 1 0     Post partum course:   Patient had an uncomplicated postpartum course.  By time of discharge on PPD#1, her pain was controlled on oral pain medications; she had appropriate lochia and was ambulating, voiding without difficulty and tolerating regular diet.  She was deemed stable for discharge to home.    Discharge Physical Exam:   BP (!) 110/58 (BP Location: Right Arm)   Pulse 61   Temp 98 F (36.7 C) (Oral)   Resp 18   Ht 5\' 7"  (1.702 m)   Wt 109.3 kg   SpO2 100%   Breastfeeding Unknown   BMI 37.75 kg/m   General: NAD CV: RRR Pulm: CTABL, nl effort ABD: s/nd/nt, fundus firm and below the umbilicus Lochia: moderate Perineum: minimal edema/intact DVT Evaluation: LE non-ttp, no evidence of DVT on exam.  Hemoglobin  Date Value Ref Range Status  06/05/2023 10.6 (L) 12.0 - 15.0 g/dL Final   HGB  Date Value Ref Range Status  08/02/2014 10.9 (L) 12.0 - 16.0 g/dL Final   HCT  Date Value Ref Range Status  06/05/2023 33.4 (L) 36.0 - 46.0 % Final  08/05/2014 30.8 (L) 35.0 - 47.0 % Final    Risk assessment for postpartum VTE and prophylactic treatment: Very high risk factors: None High risk factors: None Moderate risk factors: BMI 30-40 kg/m2  Postpartum VTE prophylaxis with LMWH not indicated  Disposition: stable, discharge to home. Baby Feeding: formula feeding Baby Disposition: home with mom  Rh Immune globulin indicated: No Rubella vaccine given: offered Varivax vaccine given: offered Flu vaccine given in AP setting: n/a Tdap vaccine given in AP setting: No  Contraception:  Depo-Provera  Prenatal Labs:  Blood type/Rh O pos  Antibody screen neg  Rubella Non-Immune  Varicella Non-Immune  RPR NR  HBsAg Neg  HIV NR  GC neg  Chlamydia neg  Genetic screening negative  1 hour GTT 67  3 hour GTT    GBS pos    Plan:  Hope Budds was discharged to home in good condition. Follow-up appointment with delivering provider in 6 weeks.  Discharge Medications: Allergies as of 06/05/2023   No Known Allergies      Medication List     TAKE these medications    acetaminophen 325 MG tablet Commonly known as: Tylenol Take 2 tablets (650 mg total) by mouth every 6 (six) hours as needed for fever or mild pain (for pain scale < 4).   benzocaine-Menthol 20-0.5 % Aero Commonly known as: DERMOPLAST Apply 1 Application topically as needed for irritation (perineal discomfort).   dibucaine 1 % Oint Commonly known as: NUPERCAINAL Place 1 Application rectally as needed for hemorrhoids.   ferrous sulfate 325 (65 FE) MG tablet Take 1 tablet (325 mg total) by mouth 2 (two) times daily with a meal.   ibuprofen 600 MG tablet Commonly known as: ADVIL Take 1 tablet (600 mg total) by mouth every 6 (six) hours as needed for cramping, mild pain or fever. What changed:  medication strength how much to take when to take this reasons to take this   Prenatal Vitamin 27-0.8 MG Tabs Take 1 tablet by mouth daily at 6 (six) AM.   senna-docusate 8.6-50 MG tablet Commonly known as: Senokot-S Take 2 tablets by mouth at bedtime as needed for mild constipation.   witch hazel-glycerin pad Commonly known as: TUCKS Apply 1 Application topically as needed for hemorrhoids.         Follow-up Information     Haroldine Laws, CNM. Schedule an appointment as soon as possible for a visit in 6 week(s).   Specialty: Certified Nurse Midwife Contact information: [address] [place] [address] [phone]                 Signed:  Blanchard Kelch 06/05/2023 4:12 PM

## 2023-06-04 NOTE — OB Triage Note (Signed)
Pt arrived to unit wheeled by ED staff with complaints of SROM at 0230. Patient reports contractions started since that time. Pt denies active vaginal bleeding. Pt placed on EFM and TOCO to non tender area of abdomen. Pt reports pain as mild. Pt stated her GBS screening was positive and she is to receive IV Abx during labor. Patient noted to have a moderate amount of clear fluid with vernix on bed pad. Will notify Provider on call of patient's arrival.

## 2023-06-05 LAB — CBC
HCT: 33.4 % — ABNORMAL LOW (ref 36.0–46.0)
Hemoglobin: 10.6 g/dL — ABNORMAL LOW (ref 12.0–15.0)
MCH: 25.8 pg — ABNORMAL LOW (ref 26.0–34.0)
MCHC: 31.7 g/dL (ref 30.0–36.0)
MCV: 81.3 fL (ref 80.0–100.0)
Platelets: 199 10*3/uL (ref 150–400)
RBC: 4.11 MIL/uL (ref 3.87–5.11)
RDW: 13.3 % (ref 11.5–15.5)
WBC: 7.3 10*3/uL (ref 4.0–10.5)
nRBC: 0 % (ref 0.0–0.2)

## 2023-06-05 MED ORDER — WITCH HAZEL-GLYCERIN EX PADS: 1.0000 | MEDICATED_PAD | CUTANEOUS | 0 refills | Status: AC | PRN

## 2023-06-05 MED ORDER — SENNOSIDES-DOCUSATE SODIUM 8.6-50 MG PO TABS: 2.0000 | ORAL_TABLET | Freq: Every evening | ORAL | 0 refills | Status: AC | PRN

## 2023-06-05 MED ORDER — MEASLES, MUMPS & RUBELLA VAC IJ SOLR: 0.5000 mL | Freq: Once | INTRAMUSCULAR | Status: DC

## 2023-06-05 MED ORDER — FERROUS SULFATE 325 (65 FE) MG PO TABS: 325.0000 mg | ORAL_TABLET | Freq: Two times a day (BID) | ORAL | 3 refills | Status: AC

## 2023-06-05 MED ORDER — DIBUCAINE (PERIANAL) 1 % EX OINT: 1.0000 | TOPICAL_OINTMENT | CUTANEOUS | 0 refills | Status: AC | PRN

## 2023-06-05 MED ORDER — IBUPROFEN 600 MG PO TABS: 600.0000 mg | ORAL_TABLET | Freq: Four times a day (QID) | ORAL | 0 refills | Status: AC | PRN

## 2023-06-05 MED ORDER — BENZOCAINE-MENTHOL 20-0.5 % EX AERO: 1.0000 | INHALATION_SPRAY | CUTANEOUS | 0 refills | Status: AC | PRN

## 2023-06-05 MED ORDER — ACETAMINOPHEN 325 MG PO TABS: 650.0000 mg | ORAL_TABLET | Freq: Four times a day (QID) | ORAL | 0 refills | Status: AC | PRN

## 2023-06-05 NOTE — Progress Notes (Signed)
Patient discharged home with family.  Discharge instructions, when to follow up, and prescriptions reviewed with patient.  Patient verbalized understanding. Patient will be escorted out by auxiliary.   

## 2023-06-05 NOTE — Discharge Instructions (Signed)

## 2023-06-05 NOTE — Progress Notes (Addendum)
Post Partum Day 1 Subjective: Doing well, no complaints.  Tolerating regular diet, pain with PO meds, voiding and ambulating without difficulty.  No CP SOB Fever,Chills, N/V or leg pain; denies nipple or breast pain, no HA change of vision, RUQ/epigastric pain  Objective: BP (!) 110/58 (BP Location: Right Arm)   Pulse 61   Temp 98 F (36.7 C) (Oral)   Resp 18   Ht 5\' 7"  (1.702 m)   Wt 109.3 kg   SpO2 100%   Breastfeeding Unknown   BMI 37.75 kg/m    Physical Exam:  General: NAD Breasts: soft/nontender CV: RRR Pulm: nl effort, CTABL Abdomen: soft, NT, BS x 4 Perineum: minimal edema, intact Lochia: moderate Uterine Fundus: fundus firm and 1 fb below umbilicus DVT Evaluation: no cords, ttp LEs   Recent Labs    06/04/23 0351 06/05/23 0523  HGB 11.2* 10.6*  HCT 34.3* 33.4*  WBC 7.9 7.3  PLT 220 199    Assessment/Plan: 27 y.o. G7P5016 postpartum day # 1  - Continue routine PP care, she was GBS positive and not adequately treated so baby will need to stay until tomorrow - Acute blood loss anemia - hemodynamically stable and asymptomatic; start po ferrous sulfate BID with stool softeners  - Immunization status: needs MMR and Varivax  Disposition: Desires Dc home today, but infant likely to remain inpatient. Will d/c home today if infant discharged.  Janyce Llanos, CNM 06/05/2023 9:02 AM

## 2023-06-09 ENCOUNTER — Telehealth: Payer: Self-pay

## 2023-09-01 ENCOUNTER — Ambulatory Visit: Payer: Medicaid Other | Admitting: Family Medicine

## 2023-09-01 DIAGNOSIS — Z309 Encounter for contraceptive management, unspecified: Secondary | ICD-10-CM | POA: Diagnosis not present

## 2023-09-01 DIAGNOSIS — Z30013 Encounter for initial prescription of injectable contraceptive: Secondary | ICD-10-CM

## 2023-09-01 DIAGNOSIS — Z3009 Encounter for other general counseling and advice on contraception: Secondary | ICD-10-CM

## 2023-09-01 DIAGNOSIS — Z7251 High risk heterosexual behavior: Secondary | ICD-10-CM

## 2023-09-01 MED ORDER — MEDROXYPROGESTERONE ACETATE 150 MG/ML IM SUSP
150.0000 mg | INTRAMUSCULAR | Status: AC
Start: 2023-09-01 — End: 2024-11-24

## 2023-09-01 MED ORDER — LEVONORGESTREL 1.5 MG PO TABS: 1.5000 mg | ORAL_TABLET | Freq: Once | ORAL | 0 refills | Status: DC

## 2023-09-01 MED ORDER — ULIPRISTAL ACETATE 30 MG PO TABS
30.0000 mg | ORAL_TABLET | Freq: Once | ORAL | Status: DC
Start: 2023-09-01 — End: 2023-09-01

## 2023-09-01 MED ORDER — ULIPRISTAL ACETATE 30 MG PO TABS
30.0000 mg | ORAL_TABLET | Freq: Once | ORAL | Status: AC
Start: 2023-09-01 — End: ?

## 2023-09-01 NOTE — Progress Notes (Signed)
[facility] Department  Postpartum Exam  Betty Chavez is a 27 y.o. [phone] female who presents for a postpartum visit. She is  12  weeks postpartum following a normal spontaneous vaginal delivery.  I have fully reviewed the prenatal and intrapartum course. The delivery was at [redacted]w[redacted]d gestational weeks.  Anesthesia: none. Postpartum course has been the most easiest and better than the others. Baby is doing well- weight 8#8oz. Baby is feeding by bottle - Similac Advance. Baby is seeing a pediatrician at the [facility]. Last visit was about age 68 months. Bleeding no bleeding. Bowel function is normal. Bladder function is normal. Patient is sexually active. Contraception method is none. Postpartum depression screening: negative.   The pregnancy intention screening data noted above was reviewed. Potential methods of contraception were discussed. The patient elected to proceed with Depo, hormonal injection.    Health Maintenance Due  Topic Date Due   Hepatitis C Screening  Never done   Cervical Cancer Screening (Pap smear)  Never done   INFLUENZA VACCINE  Never done   COVID-19 Vaccine (1 - 2023-24 season) Never done    The following portions of the patient's history were reviewed and updated as appropriate: allergies, current medications, past family history, past medical history, past social history, past surgical history, and problem list.  Review of Systems Pertinent items are noted in HPI.  Objective:  BP 103/64   Pulse 73   Ht 5\' 7"  (1.702 m)   Wt 229 lb 3.2 oz (104 kg)   LMP 08/11/2023   BMI 35.90 kg/m    General:  alert, cooperative, appears stated age, no distress, and moderately obese   Breasts:  normal  Lungs: clear to auscultation bilaterally  Heart:  regular rate and rhythm, S1, S2 normal, no murmur, click, rub or gallop  Abdomen: soft, non-tender; bowel sounds normal; no masses,  no organomegaly   Wound No wound present  GU exam:   Patient declined        Assessment:    There are no diagnoses linked to this encounter. 1. Postpartum exam  -hgb fingerstick was ordered, but patient left clinic prior to lab being obtained. Two attempts were made to contact patient by phone immediately following appointment and there was no answer and no vm set up.    2. Unprotected sex Patient had unprotected sex in the last 2 days and wishes to have ECP. Samson Frederic ordered based on pt weight.   - ulipristal acetate (ELLA) tablet 30 mg  3. Family planning Patient does not wish to have any more children at this time and desires hormonal injection contraception.   - medroxyPROGESTERone (DEPO-PROVERA) injection 150 mg    Plan:   Essential components of care per ACOG recommendations:  1.  Mood and well being: Patient with negative depression screening today. Reviewed local resources for support.  - Patient tobacco use? No.   - hx of drug use? Yes. Discussed support systems and outpatient/inpatient treatment options.  Patient reports not currently using marijuana or any other form of illegal drug.  2. Infant care and feeding:  -Patient currently breastmilk feeding? No.  -Social determinants of health (SDOH) reviewed in EPIC. Only concern was with transportation to medical appointments. She stated she has in the past had to reschedule because of not being able to get a ride. The patient was informed about Medicaid NEMT.   3. Sexuality, contraception and birth spacing - Patient does not want a pregnancy in the next year.  Desired family size is 6 children.  - Reviewed reproductive life planning. Reviewed options based on patient desire and reproductive life plan. Patient is interested in Hormonal Injection. This was provided to the patient today.   Risks, benefits, and typical effectiveness rates were reviewed.  Questions were answered.  Written information was also given to the patient to review.    The patient will follow up in  3 months for surveillance.  The  patient was told to call with any further questions, or with any concerns about this method of contraception.  Emphasized use of condoms 100% of the time for STI prevention.  Patient was offered ECP based on Unprotected sex within past 72 hours.  Patient is within 2 days of unprotected sex. Patient was offered ECP. Reviewed options and patient desired Plan B (levonorgestrel)   - Discussed birth spacing of 18 months. Patient does not want another pregnancy.   4. Sleep and fatigue -Encouraged family/partner/community support of 4 hrs of uninterrupted sleep to help with mood and fatigue  5. Physical Recovery  - Discussed patients delivery and complications. She describes her labor as good. - Patient had a Vaginal, no problems at delivery. Patient had a  no  laceration. Perineal healing reviewed. Patient expressed understanding - Patient has urinary incontinence? No. - Patient is safe to resume physical and sexual activity  6.  Health Maintenance - HM due items addressed discussed PAP with patient. She states she had one at [facility] when she was pregnant with her son most recently (this year). She signed form to request records. We will get those and determine if another PAP is due at this time and contact patient to schedule if needed.  - Last pap smear 12/02/2016 Pap smear not done at today's visit.  -Breast Cancer screening indicated? No.   7. Chronic Disease/Pregnancy Condition follow up: None  - PCP follow up  Edmonia James, NP [facility] Department

## 2023-09-01 NOTE — Progress Notes (Signed)
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
## 2023-12-19 ENCOUNTER — Emergency Department: Payer: Medicaid Other

## 2023-12-19 ENCOUNTER — Emergency Department
Admission: EM | Admit: 2023-12-19 | Discharge: 2023-12-19 | Discharge status: Against Medical Advice | Payer: Medicaid Other | Attending: Emergency Medicine | Admitting: Emergency Medicine

## 2023-12-19 DIAGNOSIS — Z5321 Procedure and treatment not carried out due to patient leaving prior to being seen by health care provider: Secondary | ICD-10-CM | POA: Diagnosis not present

## 2023-12-19 DIAGNOSIS — R0789 Other chest pain: Secondary | ICD-10-CM | POA: Diagnosis not present

## 2023-12-19 DIAGNOSIS — Z20822 Contact with and (suspected) exposure to covid-19: Secondary | ICD-10-CM | POA: Insufficient documentation

## 2023-12-19 DIAGNOSIS — R0602 Shortness of breath: Secondary | ICD-10-CM | POA: Diagnosis not present

## 2023-12-19 DIAGNOSIS — R069 Unspecified abnormalities of breathing: Secondary | ICD-10-CM | POA: Diagnosis not present

## 2023-12-19 DIAGNOSIS — R079 Chest pain, unspecified: Secondary | ICD-10-CM | POA: Diagnosis not present

## 2023-12-19 DIAGNOSIS — R059 Cough, unspecified: Secondary | ICD-10-CM | POA: Diagnosis not present

## 2023-12-19 DIAGNOSIS — R457 State of emotional shock and stress, unspecified: Secondary | ICD-10-CM | POA: Diagnosis not present

## 2023-12-19 LAB — CBC WITH DIFFERENTIAL/PLATELET
Abs Immature Granulocytes: 0.01 10*3/uL (ref 0.00–0.07)
Basophils Absolute: 0.1 10*3/uL (ref 0.0–0.1)
Basophils Relative: 1 %
Eosinophils Absolute: 0.9 10*3/uL — ABNORMAL HIGH (ref 0.0–0.5)
Eosinophils Relative: 13 %
HCT: 45.6 % (ref 36.0–46.0)
Hemoglobin: 14.7 g/dL (ref 12.0–15.0)
Immature Granulocytes: 0 %
Lymphocytes Relative: 27 %
Lymphs Abs: 1.8 10*3/uL (ref 0.7–4.0)
MCH: 26.5 pg (ref 26.0–34.0)
MCHC: 32.2 g/dL (ref 30.0–36.0)
MCV: 82.2 fL (ref 80.0–100.0)
Monocytes Absolute: 0.4 10*3/uL (ref 0.1–1.0)
Monocytes Relative: 6 %
Neutro Abs: 3.6 10*3/uL (ref 1.7–7.7)
Neutrophils Relative %: 53 %
Platelets: 331 10*3/uL (ref 150–400)
RBC: 5.55 MIL/uL — ABNORMAL HIGH (ref 3.87–5.11)
RDW: 13.1 % (ref 11.5–15.5)
WBC: 6.8 10*3/uL (ref 4.0–10.5)
nRBC: 0 % (ref 0.0–0.2)

## 2023-12-19 LAB — URINALYSIS, ROUTINE W REFLEX MICROSCOPIC
Bilirubin Urine: NEGATIVE
Glucose, UA: NEGATIVE mg/dL
Hgb urine dipstick: NEGATIVE
Ketones, ur: NEGATIVE mg/dL
Leukocytes,Ua: NEGATIVE
Nitrite: NEGATIVE
Protein, ur: NEGATIVE mg/dL
Specific Gravity, Urine: 1.016 (ref 1.005–1.030)
pH: 5 (ref 5.0–8.0)

## 2023-12-19 LAB — BASIC METABOLIC PANEL
Anion gap: 9 (ref 5–15)
BUN: 8 mg/dL (ref 6–20)
CO2: 23 mmol/L (ref 22–32)
Calcium: 9.2 mg/dL (ref 8.9–10.3)
Chloride: 104 mmol/L (ref 98–111)
Creatinine, Ser: 0.77 mg/dL (ref 0.44–1.00)
GFR, Estimated: >60 mL/min (ref >60)
Glucose, Bld: 103 mg/dL — ABNORMAL HIGH (ref 70–99)
Potassium: 3.7 mmol/L (ref 3.5–5.1)
Sodium: 136 mmol/L (ref 135–145)

## 2023-12-19 LAB — POC URINE PREG, ED: Preg Test, Ur: NEGATIVE

## 2023-12-19 LAB — RESP PANEL BY RT-PCR (RSV, FLU A&B, COVID)  RVPGX2
Influenza A by PCR: NEGATIVE
Influenza B by PCR: NEGATIVE
Resp Syncytial Virus by PCR: NEGATIVE
SARS Coronavirus 2 by RT PCR: NEGATIVE

## 2023-12-19 LAB — TROPONIN I (HIGH SENSITIVITY): Troponin I (High Sensitivity): 3 ng/L (ref <18)

## 2023-12-19 NOTE — ED Notes (Signed)
 Pt mother came to the front desk advising they will be leaving AMA

## 2023-12-19 NOTE — ED Provider Triage Note (Signed)
 Emergency Medicine Provider Triage Evaluation Note  Betty Chavez , a 28 y.o. female  was evaluated in triage.  Pt complains of SOB x 3.5 months. No hemoptysis, no personal or family history of PE/DVT. No CP. No leg swelling. Reports worse when playing in the snow today.  Review of Systems  Positive: SOB Negative: CP/fever  Physical Exam  BP 132/85 (BP Location: Right Arm)   Pulse 64   Temp 97.8 F (36.6 C) (Oral)   Resp 18   Ht 5' 7 (1.702 m)   Wt 103.9 kg   SpO2 92%   BMI 35.87 kg/m  Gen:   Awake, no distress   Resp:  Normal effort. Speaking easily in complete sentences  MSK:   Moves extremities without difficulty  Other:    Medical Decision Making  Medically screening exam initiated at 5:57 PM.  Appropriate orders placed.  Betty Chavez was informed that the remainder of the evaluation will be completed by another provider, this initial triage assessment does not replace that evaluation, and the importance of remaining in the ED until their evaluation is complete.     Betty Zaino E, PA-C 12/19/23 1757

## 2023-12-19 NOTE — ED Triage Notes (Signed)
 First Nurse note:  Per EMS, Pt, from home, c/o and "foamy" sputum after playing in the snow w/ her 8 kids.  Denies pain.   Pt was using inhaler when EMS arrived.   HR 71 RR 18 BP 132/70 O2 100% RA

## 2023-12-19 NOTE — ED Triage Notes (Signed)
 Pt BIBA from home. Pt was playing in the snow with her kids and when she went inside she began coughing and SOB. Pt states I think I worked myself up and got emotional and everything caught up to me. Pt used an inhaler that was her neighbor's. Pt has been using it since. Pt states using 8 times since yesterday. Denies history of Asthma. Pt tearful in triage but is able to speak in complete sentences in NAD.

## 2023-12-19 NOTE — ED Notes (Signed)
 Pt to Xray.

## 2024-01-15 ENCOUNTER — Ambulatory Visit: Payer: Medicaid Other
